# Patient Record
Sex: Male | Born: 1975 | Race: Black or African American | Hispanic: No | Smoking: Never smoker
Health system: Southern US, Community
[De-identification: ages and names within clinical notes are randomized; demographics above are authoritative.]

## PROBLEM LIST (undated history)

## (undated) DIAGNOSIS — G5603 Carpal tunnel syndrome, bilateral upper limbs: Secondary | ICD-10-CM

---

## 2009-01-16 ENCOUNTER — Emergency Department (HOSPITAL_COMMUNITY): Admission: EM | Admit: 2009-01-16 | Discharge: 2009-01-16 | Payer: Self-pay | Admitting: Emergency Medicine

## 2010-05-17 ENCOUNTER — Emergency Department (HOSPITAL_COMMUNITY): Admission: EM | Admit: 2010-05-17 | Discharge: 2010-05-17 | Payer: Self-pay | Admitting: Emergency Medicine

## 2010-11-05 ENCOUNTER — Emergency Department (HOSPITAL_COMMUNITY)
Admission: EM | Admit: 2010-11-05 | Discharge: 2010-11-05 | Payer: Self-pay | Source: Home / Self Care | Admitting: Emergency Medicine

## 2010-11-12 LAB — BASIC METABOLIC PANEL
BUN: 11 mg/dL (ref 6–23)
CO2: 29 mEq/L (ref 19–32)
Calcium: 9.5 mg/dL (ref 8.4–10.5)
Chloride: 106 mEq/L (ref 96–112)
Creatinine, Ser: 0.87 mg/dL (ref 0.4–1.5)
GFR calc Af Amer: >60 mL/min (ref >60)
GFR calc non Af Amer: >60 mL/min (ref >60)
Glucose, Bld: 120 mg/dL — ABNORMAL HIGH (ref 70–99)
Potassium: 4.1 mEq/L (ref 3.5–5.1)
Sodium: 141 mEq/L (ref 135–145)

## 2010-11-12 LAB — DIFFERENTIAL
Basophils Absolute: 0 10*3/uL (ref 0.0–0.1)
Basophils Relative: 0 % (ref 0–1)
Eosinophils Absolute: 0.1 10*3/uL (ref 0.0–0.7)
Eosinophils Relative: 2 % (ref 0–5)
Lymphocytes Relative: 43 % (ref 12–46)
Lymphs Abs: 2.5 10*3/uL (ref 0.7–4.0)
Monocytes Absolute: 0.3 10*3/uL (ref 0.1–1.0)
Monocytes Relative: 6 % (ref 3–12)
Neutro Abs: 2.9 10*3/uL (ref 1.7–7.7)
Neutrophils Relative %: 49 % (ref 43–77)

## 2010-11-12 LAB — CBC
HCT: 41.2 % (ref 39.0–52.0)
Hemoglobin: 14.4 g/dL (ref 13.0–17.0)
MCH: 29.6 pg (ref 26.0–34.0)
MCHC: 35 g/dL (ref 30.0–36.0)
MCV: 84.8 fL (ref 78.0–100.0)
Platelets: 189 10*3/uL (ref 150–400)
RBC: 4.86 MIL/uL (ref 4.22–5.81)
RDW: 12.8 % (ref 11.5–15.5)
WBC: 5.8 10*3/uL (ref 4.0–10.5)

## 2010-12-27 ENCOUNTER — Ambulatory Visit (INDEPENDENT_AMBULATORY_CARE_PROVIDER_SITE_OTHER): Payer: Self-pay | Admitting: Otolaryngology

## 2010-12-27 DIAGNOSIS — D487 Neoplasm of uncertain behavior of other specified sites: Secondary | ICD-10-CM

## 2010-12-27 DIAGNOSIS — R220 Localized swelling, mass and lump, head: Secondary | ICD-10-CM

## 2011-01-11 ENCOUNTER — Encounter (HOSPITAL_COMMUNITY): Payer: Self-pay | Attending: Otolaryngology

## 2011-01-11 ENCOUNTER — Other Ambulatory Visit (INDEPENDENT_AMBULATORY_CARE_PROVIDER_SITE_OTHER): Payer: Self-pay | Admitting: Otolaryngology

## 2011-01-11 DIAGNOSIS — Z01812 Encounter for preprocedural laboratory examination: Secondary | ICD-10-CM | POA: Insufficient documentation

## 2011-01-11 LAB — BASIC METABOLIC PANEL
BUN: 15 mg/dL (ref 6–23)
CO2: 25 mEq/L (ref 19–32)
Calcium: 9.3 mg/dL (ref 8.4–10.5)
Chloride: 108 mEq/L (ref 96–112)
Creatinine, Ser: 0.78 mg/dL (ref 0.4–1.5)
GFR calc Af Amer: >60 mL/min (ref >60)
GFR calc non Af Amer: >60 mL/min (ref >60)
Glucose, Bld: 147 mg/dL — ABNORMAL HIGH (ref 70–99)
Potassium: 4 mEq/L (ref 3.5–5.1)
Sodium: 139 mEq/L (ref 135–145)

## 2011-01-11 LAB — SURGICAL PCR SCREEN
MRSA, PCR: NEGATIVE
Staphylococcus aureus: NEGATIVE

## 2011-01-11 LAB — HEMOGLOBIN AND HEMATOCRIT, BLOOD
HCT: 37.8 % — ABNORMAL LOW (ref 39.0–52.0)
Hemoglobin: 13 g/dL (ref 13.0–17.0)

## 2011-01-12 LAB — CBC
HCT: 48.4 % (ref 39.0–52.0)
Hemoglobin: 16.3 g/dL (ref 13.0–17.0)
MCH: 30.1 pg (ref 26.0–34.0)
MCHC: 33.6 g/dL (ref 30.0–36.0)
MCV: 89.6 fL (ref 78.0–100.0)
Platelets: 278 10*3/uL (ref 150–400)
RBC: 5.41 MIL/uL (ref 4.22–5.81)
RDW: 12.6 % (ref 11.5–15.5)
WBC: 13.5 10*3/uL — ABNORMAL HIGH (ref 4.0–10.5)

## 2011-01-12 LAB — GLUCOSE, CAPILLARY
Glucose-Capillary: 281 mg/dL — ABNORMAL HIGH (ref 70–99)
Glucose-Capillary: 306 mg/dL — ABNORMAL HIGH (ref 70–99)
Glucose-Capillary: 389 mg/dL — ABNORMAL HIGH (ref 70–99)
Glucose-Capillary: 429 mg/dL — ABNORMAL HIGH (ref 70–99)

## 2011-01-12 LAB — URINE MICROSCOPIC-ADD ON

## 2011-01-12 LAB — DIFFERENTIAL
Basophils Absolute: 0 10*3/uL (ref 0.0–0.1)
Basophils Relative: 0 % (ref 0–1)
Eosinophils Absolute: 0.1 10*3/uL (ref 0.0–0.7)
Eosinophils Relative: 1 % (ref 0–5)
Lymphocytes Relative: 29 % (ref 12–46)
Lymphs Abs: 3.9 10*3/uL (ref 0.7–4.0)
Monocytes Absolute: 0.8 10*3/uL (ref 0.1–1.0)
Monocytes Relative: 6 % (ref 3–12)
Neutro Abs: 8.7 10*3/uL — ABNORMAL HIGH (ref 1.7–7.7)
Neutrophils Relative %: 64 % (ref 43–77)

## 2011-01-12 LAB — BASIC METABOLIC PANEL
BUN: 23 mg/dL (ref 6–23)
CO2: 25 mEq/L (ref 19–32)
Calcium: 10.2 mg/dL (ref 8.4–10.5)
Chloride: 99 mEq/L (ref 96–112)
Creatinine, Ser: 1.07 mg/dL (ref 0.4–1.5)
GFR calc Af Amer: >60 mL/min (ref >60)
GFR calc non Af Amer: >60 mL/min (ref >60)
Glucose, Bld: 428 mg/dL — ABNORMAL HIGH (ref 70–99)
Potassium: 4 mEq/L (ref 3.5–5.1)
Sodium: 136 mEq/L (ref 135–145)

## 2011-01-12 LAB — URINALYSIS, ROUTINE W REFLEX MICROSCOPIC
Bilirubin Urine: NEGATIVE
Glucose, UA: >1000 mg/dL — AB
Ketones, ur: 15 mg/dL — AB
Leukocytes, UA: NEGATIVE
Nitrite: NEGATIVE
Protein, ur: 100 mg/dL — AB
Specific Gravity, Urine: >1.03 — ABNORMAL HIGH (ref 1.005–1.030)
Urobilinogen, UA: 0.2 mg/dL (ref 0.0–1.0)
pH: 6 (ref 5.0–8.0)

## 2011-01-17 ENCOUNTER — Other Ambulatory Visit (INDEPENDENT_AMBULATORY_CARE_PROVIDER_SITE_OTHER): Payer: Self-pay | Admitting: Otolaryngology

## 2011-01-17 ENCOUNTER — Ambulatory Visit (HOSPITAL_COMMUNITY)
Admission: RE | Admit: 2011-01-17 | Discharge: 2011-01-17 | Disposition: A | Discharge status: Home / Self Care | Payer: Self-pay | Source: Ambulatory Visit | Attending: Otolaryngology | Admitting: Otolaryngology

## 2011-01-17 DIAGNOSIS — E119 Type 2 diabetes mellitus without complications: Secondary | ICD-10-CM | POA: Insufficient documentation

## 2011-01-17 DIAGNOSIS — R599 Enlarged lymph nodes, unspecified: Secondary | ICD-10-CM

## 2011-01-17 LAB — GLUCOSE, CAPILLARY: Glucose-Capillary: 149 mg/dL — ABNORMAL HIGH (ref 70–99)

## 2011-01-24 ENCOUNTER — Ambulatory Visit (INDEPENDENT_AMBULATORY_CARE_PROVIDER_SITE_OTHER): Payer: Self-pay | Admitting: Otolaryngology

## 2011-02-06 NOTE — Op Note (Signed)
NAME:  Jake Perkins, Jake Perkins                 ACCOUNT NO.:  [id number]  MEDICAL RECORD NO.:  [id number]           PATIENT TYPE:  O  LOCATION:  [facility]                          FACILITY:  [facility]  PHYSICIAN:  Newman Pies, MD            DATE OF BIRTH:  02-02-1976  DATE OF PROCEDURE:  01/17/2011 DATE OF DISCHARGE:                              OPERATIVE REPORT   SURGEON:  Newman Pies, MD  PREOPERATIVE DIAGNOSIS:  Diffuse cervical lymphadenopathy.  A 2-cm enlarged lymph nodes can be easily palpable in the submandibular area.  POSTOPERATIVE DIAGNOSIS:  Diffuse cervical lymphadenopathy.  A 2-cm enlarged lymph nodes can be easily palpable in the submandibular area.  PROCEDURE PERFORMED:  Excisional biopsy of deep submandibular lymph nodes.  ANESTHESIA:  General anesthesia via laryngeal mass.  COMPLICATIONS:  None.  ESTIMATED BLOOD LOSS:  Minimal.  INDICATIONS FOR PROCEDURE:  The patient is a 35 year old male with a 2- month history of bilateral cervical lymphadenopathy.  He recently underwent a CT scan, which showed adenopathy throughout the neck, involving bilateral level I, II and III.  A particularly large 2-cm midline submandibular lymph node is palpable.  In light of the CT and clinical findings, the decision was made for the patient to undergo excision biopsy of one of the enlarged lymph nodes.  The risks, benefits, alternatives, and details of the procedure were discussed with the patient.  Questions were invited and answered.  Informed consent was obtained.  DESCRIPTION:  The patient was taken to the operating room and placed supine on the operating table.  General anesthesia was administered by the anesthesiologist.  The patient was positioned and prepped and draped in the standard fashion for cervical lymph node biopsy.  A 1% lidocaine with 1:100,000 epinephrine was injected at the planned site of incisions.  A 2-cm mobile submandibular lymph node was noted.  It is within the submental  region, slightly to the right side.  A transverse incisions was made overlying the enlarged lymph node.  The incision was carried down past the level of the platysma muscles.  The strap muscles were identified and retracted laterally.  A 2-cm enlarged lymph node was noted.  It was carefully dissected free from the surrounding soft tissue.  Hemostasis was achieved with a combination of vessel ligation and Bovie electrocautery.  The entire lymph node was removed and sent to the Pathology Department for histologic identification.  The specimen was sent as a fresh specimens per lymphoma protocol.  The surgical site was copiously irrigated.  The incision was closed in layers with 4-0 Vicryl and Dermabond.  That concluded the procedure for the patient. The care of the patient was turned over to the anesthesiologist.  The patient was awakened from anesthesia without difficulty.  He was extubated and transferred to the recovery room in good condition.  OPERATIVE FINDINGS:  Enlarged 2-cm submandibular lymph node was noted. It was excised and sent to the Pathology Department as a fresh specimen.  SPECIMEN:  Enlarged submandibular lymph nodes.  FOLLOWUP CARE:  The patient will be discharged home once he is awake and  alert.  He will be placed on Vicodin 1-2 tablet p.o. q.4-6 h. p.r.n. pain, and amoxicillin 500 mg p.o. t.i.d. for 5 days.  He will follow up in my office in approximately 1 week.     Newman Pies, MD     ST/MEDQ  D:  01/17/2011  T:  01/17/2011  Job:  [id number]  cc:   [facility]  Electronically Signed by Newman Pies MD on 02/06/2011 11:32:42 AM

## 2011-07-11 ENCOUNTER — Encounter: Payer: Self-pay | Admitting: Emergency Medicine

## 2011-07-11 ENCOUNTER — Emergency Department (HOSPITAL_COMMUNITY)
Admission: EM | Admit: 2011-07-11 | Discharge: 2011-07-11 | Disposition: A | Discharge status: Home / Self Care | Payer: Self-pay | Attending: Emergency Medicine | Admitting: Emergency Medicine

## 2011-07-11 DIAGNOSIS — E119 Type 2 diabetes mellitus without complications: Secondary | ICD-10-CM | POA: Insufficient documentation

## 2011-07-11 DIAGNOSIS — K1120 Sialoadenitis, unspecified: Secondary | ICD-10-CM | POA: Insufficient documentation

## 2011-07-11 DIAGNOSIS — K1121 Acute sialoadenitis: Secondary | ICD-10-CM

## 2011-07-11 MED ORDER — HYDROCODONE-ACETAMINOPHEN 5-325 MG PO TABS: 1.0000 | ORAL_TABLET | Freq: Four times a day (QID) | ORAL | Status: AC | PRN

## 2011-07-11 MED ORDER — HYDROCODONE-ACETAMINOPHEN 5-325 MG PO TABS
1.0000 | ORAL_TABLET | Freq: Once | ORAL | Status: AC
  Administered 2011-07-11: 1 via ORAL
  Filled 2011-07-11: qty 1

## 2011-07-11 MED ORDER — CEPHALEXIN 500 MG PO CAPS: 500.0000 mg | ORAL_CAPSULE | Freq: Four times a day (QID) | ORAL | Status: AC

## 2011-07-11 MED ORDER — CEPHALEXIN 500 MG PO CAPS
500.0000 mg | ORAL_CAPSULE | Freq: Once | ORAL | Status: AC
  Administered 2011-07-11: 500 mg via ORAL
  Filled 2011-07-11: qty 1

## 2011-07-11 NOTE — ED Provider Notes (Signed)
Medical screening examination/treatment/procedure(s) were performed by non-physician practitioner and as supervising physician I was immediately available for consultation/collaboration.   Cheree Fowles M Ciena Sampley, DO 07/11/11 1930 

## 2011-07-11 NOTE — ED Provider Notes (Signed)
History     CSN: [id number] Arrival date & time: 07/11/2011  2:50 PM   Chief Complaint  Patient presents with  . Neck Pain     (Include location/radiation/quality/duration/timing/severity/associated sxs/prior treatment) HPI Comments: swelling worsens after eating or drinking.  Patient is a 35 y.o. male presenting with neck pain. The history is provided by the patient. No language interpreter was used.  Neck Pain  This is a new problem. The current episode started 2 days ago. The problem occurs constantly. The problem has not changed since onset.The pain is associated with nothing.     Past Medical History  Diagnosis Date  . Diabetes mellitus      History reviewed. No pertinent past surgical history.  History reviewed. No pertinent family history.  History  Substance Use Topics  . Smoking status: Never Smoker   . Smokeless tobacco: Not on file  . Alcohol Use: No      Review of Systems  HENT: Positive for neck pain. Negative for sore throat and dental problem.     Allergies  Review of patient's allergies indicates no known allergies.  Home Medications  No current outpatient prescriptions on file.  Physical Exam    BP 130/79  Pulse 84  Temp(Src) 98.4 F (36.9 C) (Oral)  Resp 17  Ht 5\' 5"  (1.651 m)  Wt 175 lb (79.379 kg)  BMI 29.12 kg/m2  SpO2 100%  Physical Exam  Nursing note and vitals reviewed. Constitutional: He is oriented to person, place, and time. Vital signs are normal. He appears well-developed and well-nourished. No distress.  HENT:  Head: Normocephalic and atraumatic.    Right Ear: External ear normal.  Left Ear: External ear normal.  Nose: Nose normal.  Mouth/Throat: No oropharyngeal exudate.  Eyes: Conjunctivae and EOM are normal. Pupils are equal, round, and reactive to light. Right eye exhibits no discharge. Left eye exhibits no discharge. No scleral icterus.  Neck: Normal range of motion. Neck supple. No JVD present. No tracheal  deviation present. No thyromegaly present.  Cardiovascular: Normal rate, regular rhythm, normal heart sounds, intact distal pulses and normal pulses.  Exam reveals no gallop and no friction rub.   No murmur heard. Pulmonary/Chest: Effort normal and breath sounds normal. No stridor. No respiratory distress. He has no wheezes. He has no rales. He exhibits no tenderness.  Abdominal: Soft. Normal appearance and bowel sounds are normal. He exhibits no distension and no mass. There is no tenderness. There is no rebound and no guarding.  Musculoskeletal: Normal range of motion. He exhibits no edema and no tenderness.  Lymphadenopathy:    He has no cervical adenopathy.  Neurological: He is alert and oriented to person, place, and time. He has normal reflexes. No cranial nerve deficit. Coordination normal. GCS eye subscore is 4. GCS verbal subscore is 5. GCS motor subscore is 6.  Skin: Skin is warm and dry. No rash noted. He is not diaphoretic.  Psychiatric: He has a normal mood and affect. His speech is normal and behavior is normal. Judgment and thought content normal. Cognition and memory are normal.    ED Course  Procedures  Results for orders placed during the hospital encounter of 01/17/11  GLUCOSE, CAPILLARY      Component Value Range   Glucose-Capillary 149 (*) 70 - 99 (mg/dL)   No results found.   No diagnosis found.   MDM        Worthy Rancher, PA 07/11/11 1710

## 2011-07-11 NOTE — ED Notes (Signed)
Pt c/o enlarged gland on his neck. Pt states this happens at times. He states it started swelling today.

## 2011-07-11 NOTE — ED Notes (Signed)
Correction to discharge time: 1748

## 2012-01-16 ENCOUNTER — Emergency Department (HOSPITAL_COMMUNITY)
Admission: EM | Admit: 2012-01-16 | Discharge: 2012-01-16 | Disposition: A | Discharge status: Home / Self Care | Payer: Self-pay | Attending: Emergency Medicine | Admitting: Emergency Medicine

## 2012-01-16 ENCOUNTER — Encounter (HOSPITAL_COMMUNITY): Payer: Self-pay | Admitting: *Deleted

## 2012-01-16 DIAGNOSIS — G5600 Carpal tunnel syndrome, unspecified upper limb: Secondary | ICD-10-CM | POA: Insufficient documentation

## 2012-01-16 DIAGNOSIS — E119 Type 2 diabetes mellitus without complications: Secondary | ICD-10-CM | POA: Insufficient documentation

## 2012-01-16 DIAGNOSIS — R209 Unspecified disturbances of skin sensation: Secondary | ICD-10-CM | POA: Insufficient documentation

## 2012-01-16 LAB — GLUCOSE, CAPILLARY: Glucose-Capillary: 169 mg/dL — ABNORMAL HIGH (ref 70–99)

## 2012-01-16 NOTE — ED Notes (Signed)
Pt c/o pain in his arms and numbness in his hands x 1 week intermittently. States that it hurts worse when he is at work.

## 2012-01-16 NOTE — Discharge Instructions (Signed)
Carpal Tunnel Syndrome The carpal tunnel is a narrow hollow area in the wrist. It is formed by the wrist bones and ligaments. Nerves, blood vessels, and tendons (cord like structures which attach muscle to bone) on the palm side (the side of your hand in the direction your fingers bend) of your hand pass through the carpal tunnel. Repeated wrist motion or certain diseases may cause swelling within the tunnel. (That is why these are called repetitive trauma (damage caused by over use) disorders. It is also a common problem in late pregnancy.) This swelling pinches the main nerve in the wrist (median nerve) and causes the painful condition called carpal tunnel syndrome. A feeling of "pins and needles" may be noticed in the fingers or hand; however, the entire arm may ache from this condition. Carpal tunnel syndrome may clear up by itself. Cortisone injections may help. Sometimes, an operation may be needed to free the pinched nerve. An electromyogram (a type of test) may be needed to confirm this diagnosis (learning what is wrong). This is a test which measures nerve conduction. The nerve conduction is usually slowed in a carpal tunnel syndrome. HOME CARE INSTRUCTIONS   If your caregiver prescribed medication to help reduce swelling, take as directed.   If you were given a splint to keep your wrist from bending, use it as instructed. It is important to wear the splint at night. Use the splint for as long as you have pain or numbness in your hand, arm or wrist. This may take 1 to 2 months.   If you have pain at night, it may help to rub or shake your hand, or elevate your hand above the level of your heart (the center of your chest).   It is important to give your wrist a rest by stopping the activities that are causing the problem. If your symptoms (problems) are work-related, you may need to talk to your employer about changing to a job that does not require using your wrist.   Only take over-the-counter  or prescription medicines for pain, discomfort, or fever as directed by your caregiver.   Following periods of extended use, particularly strenuous use, apply an ice pack wrapped in a towel to the anterior (palm) side of the affected wrist for 20 to 30 minutes. Repeat as needed three to four times per day. This will help reduce the swelling.   Follow all instructions for follow-up with your caregiver. This includes any orthopedic referrals, physical therapy, and rehabilitation. Any delay in obtaining necessary care could result in a delay or failure of your condition to heal.  SEEK IMMEDIATE MEDICAL CARE IF:   You are still having pain and numbness following a week of treatment.   You develop new, unexplained symptoms.   Your current symptoms are getting worse and are not helped or controlled with medications.  MAKE SURE YOU:   Understand these instructions.   Will watch your condition.   Will get help right away if you are not doing well or get worse.  Document Released: 10/11/2000 Document Revised: 10/03/2011 Document Reviewed: 08/30/2011 Exit[employer] Patient Information 2012 [employer]. 

## 2012-01-16 NOTE — ED Provider Notes (Signed)
History     CSN: [id number]  Arrival date & time 01/16/12  1753   First MD Initiated Contact with Patient 01/16/12 1839      Chief Complaint  Patient presents with  . Numbness    (Consider location/radiation/quality/duration/timing/severity/associated sxs/prior treatment) The history is provided by the patient.   Patient here with numbness to both hands x1 week. Symptoms are worse in the evenings after work. 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 Rest makes her symptoms better. Denies any severe headaches, ataxia, or other neurological findings. No medications used her therapy prior to arrival. No prior history of this. Past Medical History  Diagnosis Date  . Diabetes mellitus     History reviewed. No pertinent past surgical history.  History reviewed. No pertinent family history.  History  Substance Use Topics  . Smoking status: Never Smoker   . Smokeless tobacco: Not on file  . Alcohol Use: No      Review of Systems  All other systems reviewed and are negative.    Allergies  Review of patient's allergies indicates no known allergies.  Home Medications   Current Outpatient Rx  Name Route Sig Dispense Refill  . LISINOPRIL 5 MG PO TABS Oral Take 5 mg by mouth daily.    Marland Kitchen METFORMIN HCL ER 500 MG PO TB24 Oral Take 500 mg by mouth daily with breakfast.    . ONE-A-DAY MENS PO TABS Oral Take 1 tablet by mouth daily.      BP 143/79  Pulse 96  Temp(Src) 98.5 F (36.9 C) (Oral)  Resp 16  Ht 5\' 9"  (1.753 m)  Wt 179 lb (81.194 kg)  BMI 26.43 kg/m2  SpO2 98%  Physical Exam  Nursing note and vitals reviewed. Constitutional: He is oriented to person, place, and time. Vital signs are normal. He appears well-developed and well-nourished.  Non-toxic appearance. No distress.  HENT:  Head: Normocephalic and atraumatic.  Eyes: Conjunctivae, EOM and lids are normal. Pupils are equal, round, and reactive to light.  Neck: Normal range of  motion. Neck supple. No tracheal deviation present. No mass present.  Cardiovascular: Normal rate, regular rhythm and normal heart sounds.  Exam reveals no gallop.   No murmur heard. Pulmonary/Chest: Effort normal and breath sounds normal. No stridor. No respiratory distress. He has no decreased breath sounds. He has no wheezes. He has no rhonchi. He has no rales.  Abdominal: Soft. Normal appearance and bowel sounds are normal. He exhibits no distension. There is no tenderness. There is no rebound and no CVA tenderness.  Musculoskeletal: Normal range of motion. He exhibits no edema and no tenderness.       Patient with normal flexion and extension of wrist bilaterally. Radial pulse normal both sides. Neurovascular intact distally in both hands.  Neurological: He is alert and oriented to person, place, and time. He has normal strength. No cranial nerve deficit or sensory deficit. GCS eye subscore is 4. GCS verbal subscore is 5. GCS motor subscore is 6.  Skin: Skin is warm and dry. No abrasion and no rash noted.  Psychiatric: He has a normal mood and affect. His speech is normal and behavior is normal.    ED Course  Procedures (including critical care time)  Labs Reviewed  GLUCOSE, CAPILLARY - Abnormal; Notable for the following:    Glucose-Capillary 169 (*)    All other components within normal limits   No results found.   No diagnosis found.  MDM  Suspect that cause of patient's current symptoms as carpal tunnel syndrome. He'll be placed in Velcro wrist splints and will followup with his Dr. as needed        Toy Baker, MD 01/16/12 [phone]

## 2012-03-30 ENCOUNTER — Emergency Department (HOSPITAL_COMMUNITY): Payer: Self-pay

## 2012-03-30 ENCOUNTER — Emergency Department (HOSPITAL_COMMUNITY)
Admission: EM | Admit: 2012-03-30 | Discharge: 2012-03-30 | Disposition: A | Discharge status: Home / Self Care | Payer: Self-pay | Attending: Emergency Medicine | Admitting: Emergency Medicine

## 2012-03-30 ENCOUNTER — Encounter (HOSPITAL_COMMUNITY): Payer: Self-pay | Admitting: *Deleted

## 2012-03-30 DIAGNOSIS — R059 Cough, unspecified: Secondary | ICD-10-CM | POA: Insufficient documentation

## 2012-03-30 DIAGNOSIS — R05 Cough: Secondary | ICD-10-CM | POA: Insufficient documentation

## 2012-03-30 DIAGNOSIS — M25519 Pain in unspecified shoulder: Secondary | ICD-10-CM | POA: Insufficient documentation

## 2012-03-30 DIAGNOSIS — K5900 Constipation, unspecified: Secondary | ICD-10-CM | POA: Insufficient documentation

## 2012-03-30 DIAGNOSIS — R109 Unspecified abdominal pain: Secondary | ICD-10-CM | POA: Insufficient documentation

## 2012-03-30 DIAGNOSIS — R0789 Other chest pain: Secondary | ICD-10-CM

## 2012-03-30 DIAGNOSIS — E119 Type 2 diabetes mellitus without complications: Secondary | ICD-10-CM | POA: Insufficient documentation

## 2012-03-30 DIAGNOSIS — M949 Disorder of cartilage, unspecified: Secondary | ICD-10-CM | POA: Insufficient documentation

## 2012-03-30 DIAGNOSIS — IMO0001 Reserved for inherently not codable concepts without codable children: Secondary | ICD-10-CM | POA: Insufficient documentation

## 2012-03-30 DIAGNOSIS — M899 Disorder of bone, unspecified: Secondary | ICD-10-CM | POA: Insufficient documentation

## 2012-03-30 LAB — URINALYSIS, ROUTINE W REFLEX MICROSCOPIC
Glucose, UA: 250 mg/dL — AB
Hgb urine dipstick: NEGATIVE
Specific Gravity, Urine: 1.025 (ref 1.005–1.030)
Urobilinogen, UA: 0.2 mg/dL (ref 0.0–1.0)
pH: 6 (ref 5.0–8.0)

## 2012-03-30 LAB — BASIC METABOLIC PANEL
CO2: 25 mEq/L (ref 19–32)
Chloride: 105 mEq/L (ref 96–112)
GFR calc non Af Amer: >90 mL/min (ref >90)
Glucose, Bld: 158 mg/dL — ABNORMAL HIGH (ref 70–99)
Potassium: 3.6 mEq/L (ref 3.5–5.1)
Sodium: 138 mEq/L (ref 135–145)

## 2012-03-30 MED ORDER — POLYETHYLENE GLYCOL 3350 17 GM/SCOOP PO POWD: 17.0000 g | Freq: Every day | ORAL | Status: AC

## 2012-03-30 MED ORDER — NAPROXEN 500 MG PO TABS: 500.0000 mg | ORAL_TABLET | Freq: Two times a day (BID) | ORAL | Status: DC

## 2012-03-30 NOTE — ED Notes (Signed)
Pt has swollen area lower sternal area for mos,  Body aches,  Cough, nausea, no vomiting.  Constipation.

## 2012-03-30 NOTE — Discharge Instructions (Signed)
Arthralgia Arthralgia is joint pain. A joint is a place where two bones meet. Joint pain can happen for many reasons. The joint can be bruised, stiff, infected, or weak from aging. Pain usually goes away after resting and taking medicine for soreness.  HOME CARE  Rest the joint as told by your doctor.   Keep the sore joint raised (elevated) for the first 24 hours.   Put ice on the joint area.   Put ice in a plastic bag.   Place a towel between your skin and the bag.   Leave the ice on for 15 to 20 minutes, 3 to 4 times a day.   Wear your splint, casting, elastic bandage, or sling as told by your doctor.   Only take medicine as told by your doctor. Do not take aspirin.   Use crutches as told by your doctor. Do not put weight on the joint until told to by your doctor.  GET HELP RIGHT AWAY IF:   You have bruising, puffiness (swelling), or more pain.   Your fingers or toes turn blue or start to lose feeling (numb).   Your medicine does not lessen the pain.   Your pain becomes severe.   You have a temperature by mouth above 102 F (38.9 C), not controlled by medicine.   You cannot move or use the joint.  MAKE SURE YOU:   Understand these instructions.   Will watch your condition.   Will get help right away if you are not doing well or get worse.  Document Released: 10/02/2009 Document Revised: 10/03/2011 Document Reviewed: 10/02/2009 Exit[employer] Patient Information 2012 [employer]. 

## 2012-04-01 NOTE — ED Provider Notes (Signed)
History     CSN: [id number]  Arrival date & time 03/30/12  1055   First MD Initiated Contact with Patient 03/30/12 1201      No chief complaint on file.   (Consider location/radiation/quality/duration/timing/severity/associated sxs/prior treatment) HPI Comments: Jake Perkins presents with multiple complaints today,  The first being constipation and intermittent abdominal cramping which has been presents for months,  States his last "real" bowel movement was over 1 week ago.  He denies any nausea,  Vomiting,  Fevers or chills.  He also describes intermittent generalized body aches and non productive cough without chest pain or shortness of breath.  He has noticed pain in his left shoulder which is persistent when he raises his left arm above shoulder level and has noticed a bony prominence at his lateral left clavicle which is tender. 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  Last complaint is for tenderness at his xiphoid process which has been present for months and worse with palpation - he denies any injury to explain this symptom.  The history is provided by the patient.    Past Medical History  Diagnosis Date  . Diabetes mellitus     History reviewed. No pertinent past surgical history.  History reviewed. No pertinent family history.  History  Substance Use Topics  . Smoking status: Never Smoker   . Smokeless tobacco: Not on file  . Alcohol Use: No      Review of Systems  Constitutional: Negative for fever.  HENT: Negative for congestion, sore throat and neck pain.   Eyes: Negative.   Respiratory: Positive for cough. Negative for chest tightness and shortness of breath.   Cardiovascular: Negative for chest pain.  Gastrointestinal: Positive for constipation. Negative for nausea, vomiting, abdominal pain, diarrhea and rectal pain.  Genitourinary: Negative.     Musculoskeletal: Positive for arthralgias. Negative for joint swelling.  Skin: Negative.  Negative for rash and wound.  Neurological: Negative for dizziness, weakness, light-headedness, numbness and headaches.  Hematological: Negative.   Psychiatric/Behavioral: Negative.     Allergies  Review of patient's allergies indicates no known allergies.  Home Medications   Current Outpatient Rx  Name Route Sig Dispense Refill  . LISINOPRIL 5 MG PO TABS Oral Take 5 mg by mouth daily.    Marland Kitchen METFORMIN HCL 500 MG PO TABS Oral Take 500 mg by mouth 2 (two) times daily with a meal.    . ONE-A-DAY MENS PO TABS Oral Take 1 tablet by mouth daily.    Marland Kitchen NAPROXEN 500 MG PO TABS Oral Take 1 tablet (500 mg total) by mouth 2 (two) times daily. 30 tablet 0  . POLYETHYLENE GLYCOL 3350 PO POWD Oral Take 17 g by mouth daily. 527 g 0    BP 140/71  Pulse 98  Temp(Src) 98.5 F (36.9 C) (Oral)  Resp 20  Ht 5\' 5"  (1.651 m)  Wt 180 lb (81.647 kg)  BMI 29.95 kg/m2  SpO2 97%  Physical Exam  Nursing note and vitals reviewed. Constitutional: He appears well-developed and well-nourished.  HENT:  Head: Normocephalic and atraumatic.  Eyes: Conjunctivae are normal.  Neck: Normal range of motion.  Cardiovascular: Normal rate, regular rhythm, normal heart sounds and intact distal pulses.   Pulmonary/Chest: Effort normal and breath sounds normal. No respiratory distress. He has no decreased breath sounds. He has no wheezes. He has no rhonchi. He has no  rales. He exhibits tenderness.         TTP of xiphoid process without enlargement or deformity.  Abdominal: Soft. Bowel sounds are normal. There is no tenderness.  Musculoskeletal: Normal range of motion.       Left shoulder: He exhibits tenderness. He exhibits no deformity.       TTP at [facility] joint with slight bony prominence.  Pt has FROM of left shoulder with no crepitus,  Modest discomfort with flexion and abduction above shoulder height.    Neurological: He is  alert.       Equal grip strength.  Skin: Skin is warm and dry.  Psychiatric: He has a normal mood and affect.    ED Course  Procedures (including critical care time)  Labs Reviewed  BASIC METABOLIC PANEL - Abnormal; Notable for the following:    Glucose, Bld 158 (*)    All other components within normal limits  URINALYSIS, ROUTINE W REFLEX MICROSCOPIC - Abnormal; Notable for the following:    Glucose, UA 250 (*)    All other components within normal limits  LAB REPORT - SCANNED   Dg Shoulder Left  03/30/2012  *RADIOLOGY REPORT*  Clinical Data: Pain without trauma.  LEFT SHOULDER - 2+ VIEW  Comparison: None.  Findings: Negative for fracture, dislocation, or other acute abnormality.  Normal alignment and mineralization. No significant degenerative change.  Regional soft tissues unremarkable.  IMPRESSION:  Negative  Original Report Authenticated By: Osa Craver, M.D.   Dg Abd Acute W/chest  03/30/2012  *RADIOLOGY REPORT*  Clinical Data: Abdominal pain, constipation  ACUTE ABDOMEN SERIES (ABDOMEN 2 VIEW & CHEST 1 VIEW)  Comparison: None.  Findings: Lungs are clear.  Heart size normal. No effusion.  No free air.  Normal bowel gas pattern.  No abnormal abdominal calcifications.  Visualized bones unremarkable.  IMPRESSION:  1.  Normal bowel gas pattern. 2.  No acute cardiopulmonary disease.  Original Report Authenticated By: Osa Craver, M.D.     1. Constipation   2. Xiphoidalgia   3. Shoulder pain       MDM   xrays and labs reviewed.  No findings consistent with emergent condition.  Pt prescribed miralax and naproxen.  Encouraged heat therapy to xiphoid area,  Avoid rubbing area. F/u with his pcp for further concerns ([facility]  The patient appears reasonably screened and/or stabilized for discharge and I doubt any other medical condition or other [facility] requiring further screening, evaluation, or treatment in the ED at this time prior to  discharge.        Burgess Amor, [place] 04/01/12 1244

## 2012-04-03 NOTE — ED Provider Notes (Signed)
Medical screening examination/treatment/procedure(s) were performed by non-physician practitioner and as supervising physician I was immediately available for consultation/collaboration.  Haevyn Ury, MD 04/03/12 0712 

## 2012-09-10 ENCOUNTER — Telehealth (HOSPITAL_COMMUNITY): Payer: Self-pay | Admitting: Dietician

## 2012-09-10 NOTE — Telephone Encounter (Signed)
Pt registered for diabetes class on 09/10/12 at 6:30 PM. Pt was a no-show for class.  

## 2012-10-08 ENCOUNTER — Encounter (HOSPITAL_COMMUNITY): Payer: Self-pay | Admitting: Dietician

## 2012-10-08 NOTE — Progress Notes (Signed)
[facility] Diabetes Class Completion  Date:October 08, 2012  Time: 6:30 PM  Pt attended [facility]'s Diabetes Group Education Class on October 08, 2012.   Patient was educated on the following topics: survival skills (signs and symptoms of hyperglycemia and hypoglycemia, treatment for hypoglycemia, ideal levels for fasting and postprandial blood sugars, goal Hgb A1c level, foot care basics), recommendations for physical activity, carbohydrate metabolism in relation to diabetes, and meal planning (sources of carbohydrate, carbohydrate counting, meal planning strategies, food label reading, and portion control).   Melody Haver, RD, LDN

## 2012-12-23 ENCOUNTER — Emergency Department (HOSPITAL_COMMUNITY): Payer: Self-pay

## 2012-12-23 ENCOUNTER — Encounter (HOSPITAL_COMMUNITY): Payer: Self-pay

## 2012-12-23 ENCOUNTER — Emergency Department (HOSPITAL_COMMUNITY)
Admission: EM | Admit: 2012-12-23 | Discharge: 2012-12-23 | Disposition: A | Discharge status: Home / Self Care | Payer: Self-pay | Attending: Emergency Medicine | Admitting: Emergency Medicine

## 2012-12-23 DIAGNOSIS — R197 Diarrhea, unspecified: Secondary | ICD-10-CM | POA: Insufficient documentation

## 2012-12-23 DIAGNOSIS — E119 Type 2 diabetes mellitus without complications: Secondary | ICD-10-CM | POA: Insufficient documentation

## 2012-12-23 DIAGNOSIS — R1032 Left lower quadrant pain: Secondary | ICD-10-CM | POA: Insufficient documentation

## 2012-12-23 DIAGNOSIS — K5900 Constipation, unspecified: Secondary | ICD-10-CM | POA: Insufficient documentation

## 2012-12-23 DIAGNOSIS — Z79899 Other long term (current) drug therapy: Secondary | ICD-10-CM | POA: Insufficient documentation

## 2012-12-23 DIAGNOSIS — R109 Unspecified abdominal pain: Secondary | ICD-10-CM

## 2012-12-23 LAB — URINALYSIS, ROUTINE W REFLEX MICROSCOPIC
Glucose, UA: NEGATIVE mg/dL
Leukocytes, UA: NEGATIVE
Protein, ur: NEGATIVE mg/dL
Specific Gravity, Urine: 1.02 (ref 1.005–1.030)

## 2012-12-23 MED ORDER — POLYETHYLENE GLYCOL 3350 17 G PO PACK: 17.0000 g | PACK | Freq: Every day | ORAL | Status: DC

## 2012-12-23 MED ORDER — DOCUSATE SODIUM 100 MG PO CAPS: 100.0000 mg | ORAL_CAPSULE | Freq: Two times a day (BID) | ORAL | Status: DC

## 2012-12-23 NOTE — ED Provider Notes (Signed)
    Medical screening examination/treatment/procedure(s) were conducted as a shared visit with non-physician practitioner(s) and myself.  I personally evaluated the patient during the encounter  Please see my separate respective documentation pertaining to this patient encounter    Vida Roller, MD 12/23/12 [phone]

## 2012-12-23 NOTE — ED Notes (Signed)
Pt reports has had pain in his right side off and on for the past few months.  Reports abd pain started last night after eating.  Reports diarrhea. No n/v.

## 2012-12-23 NOTE — ED Provider Notes (Signed)
History     CSN: [id number]  Arrival date & time 12/23/12  1150   First MD Initiated Contact with Patient 12/23/12 1233      Chief Complaint  Patient presents with  . Abdominal Pain    (Consider location/radiation/quality/duration/timing/severity/associated sxs/prior treatment) Patient is a 37 y.o. male presenting with abdominal pain. The history is provided by the patient.  Abdominal Pain Pain location:  LLQ Pain radiates to:  Does not radiate Pain severity:  Mild Onset quality:  Sudden Duration:  3 hours Timing:  Constant Progression:  Improving Chronicity:  New Context: suspicious food intake (ate black beans last night which often cause diarrhea)   Context: not sick contacts   Relieved by:  Nothing Worsened by:  Nothing tried Ineffective treatments:  None tried Associated symptoms: constipation and diarrhea   Associated symptoms: no fever and no vomiting   Diarrhea:    Quality:  Watery   Severity:  Mild   Duration:  3 hours   Timing:  Constant   Progression:  Improving The diarrhea happened this morning while Mr. Kolbeck was at work. He said there was only a small amount. It was nonbloody. Prior to this episode he has not been able to have a bowel movement for 4-7 days. He does not remember his last BM. He occasionally uses laxatives and takes fiber which has been helpful in the past.   Mr. Atkerson also complains of a pain in his side. This pain alternates between throbbing and sharp. It radiates to his back. The pain alternates between his right and left side. When the pain comes it only lasts for a few seconds. He has been seen by his primary care provider for this issue, but not given a diagnosis. The pain is not currently present.   He states he is 80% compliant with his home medication.   Past Medical History  Diagnosis Date  . Diabetes mellitus     History reviewed. No pertinent past surgical history.  No family history on file.  History  Substance Use Topics   . Smoking status: Never Smoker   . Smokeless tobacco: Not on file  . Alcohol Use: No      Review of Systems  Constitutional: Negative for fever.  Gastrointestinal: Positive for abdominal pain, diarrhea and constipation. Negative for vomiting.  All other systems reviewed and are negative.    Allergies  Review of patient's allergies indicates no known allergies.  Home Medications   Current Outpatient Rx  Name  Route  Sig  Dispense  Refill  . glipiZIDE (GLUCOTROL) 10 MG tablet   Oral   Take 10 mg by mouth daily. Patient has just been prescribed medication and he has not picked up from pharmacy         . lisinopril (PRINIVIL,ZESTRIL) 5 MG tablet   Oral   Take 5 mg by mouth at bedtime.          . metFORMIN (GLUCOPHAGE) 500 MG tablet   Oral   Take 500 mg by mouth 2 (two) times daily with a meal.         . omega-3 acid ethyl esters (LOVAZA) 1 G capsule   Oral   Take 1 g by mouth 3 (three) times daily.           BP 140/78  Pulse 89  Temp(Src) 98.1 F (36.7 C) (Oral)  Resp 20  Ht 5\' 6"  (1.676 m)  Wt 175 lb (79.379 kg)  BMI 28.26 kg/m2  SpO2  99%  Physical Exam  Constitutional: Vital signs are normal. He appears well-developed and well-nourished. He is cooperative.  Non-toxic appearance. He does not have a sickly appearance. He does not appear ill. No distress.  HENT:  Head: Normocephalic and atraumatic.  Right Ear: External ear normal.  Left Ear: External ear normal.  Nose: Nose normal.  Mouth/Throat: Oropharynx is clear and moist and mucous membranes are normal.  Eyes: Conjunctivae, EOM and lids are normal.  Neck: Trachea normal, normal range of motion and phonation normal.  Cardiovascular: Normal rate, regular rhythm, S1 normal, S2 normal and normal heart sounds.  Exam reveals no gallop and no friction rub.   No murmur heard. Pulmonary/Chest: Effort normal and breath sounds normal. No respiratory distress. He has no wheezes. He has no rales.  Abdominal:  Soft. Bowel sounds are normal. He exhibits no distension. There is tenderness in the left lower quadrant. There is no rebound.  Musculoskeletal: Normal range of motion.  No tenderness or reproducible symptoms over left or right flank where patient states pain comes and goes - no swelling or erythema   Neurological: He is alert.  Skin: Skin is warm and dry.  Psychiatric: He has a normal mood and affect. His speech is normal and behavior is normal.    ED Course  Procedures (including critical care time)  Labs Reviewed  URINALYSIS, ROUTINE W REFLEX MICROSCOPIC   Results for orders placed during the hospital encounter of 12/23/12  URINALYSIS, ROUTINE W REFLEX MICROSCOPIC      Result Value Range   Color, Urine YELLOW  YELLOW   APPearance CLEAR  CLEAR   Specific Gravity, Urine 1.020  1.005 - 1.030   pH 6.5  5.0 - 8.0   Glucose, UA NEGATIVE  NEGATIVE mg/dL   Hgb urine dipstick NEGATIVE  NEGATIVE   Bilirubin Urine NEGATIVE  NEGATIVE   Ketones, ur NEGATIVE  NEGATIVE mg/dL   Protein, ur NEGATIVE  NEGATIVE mg/dL   Urobilinogen, UA 0.2  0.0 - 1.0 mg/dL   Nitrite NEGATIVE  NEGATIVE   Leukocytes, UA NEGATIVE  NEGATIVE   Dg Abd 1 View  12/23/2012  *RADIOLOGY REPORT*  Clinical Data: Abdominal pain.  ABDOMEN - 1 VIEW  Comparison: 03/30/2012  Findings: There is a nonobstructive bowel gas pattern.  No supine evidence of free air.  No organomegaly or suspicious calcification.  No acute bony abnormality.  IMPRESSION: No acute findings.   Original Report Authenticated By: Charlett Nose, M.D.      1. Constipation   2. Abdominal pain       MDM  KUB showed no acute findings. History consistent with constipation. No concern for obstruction or perforation. Given laxative and stool softener. Follow up with PCP for maintenence issues. Patient voices understanding and agrees with the plan. UA to rule out UTI following dark concentrated looking urine - WNL.        Mora Bellman, PA-C 12/23/12  1451

## 2012-12-23 NOTE — ED Provider Notes (Signed)
37 year old male with no significant medical history presents with a complaint of intermittent lower abdominal pain. He doesn't feel like he can pinpoint exactly where the pain is and he is not currently having the pain. He does admit to having only one bowel movement in the last week which was 40 60, he often goes long periods of time without a bowel movement. He denies nausea vomiting fevers chills dysuria or diarrhea but he does admit to having urinary frequency over the last couple of days.  On exam the patient is a soft nontender abdomen with no masses, no peritoneal signs, no guarding. He does not appear to be tender on my exam. His heart and lungs are clear.  KUB has been performed showing signs of stool but no overt constipation with fecal impaction. The patient appears well, he has dark-colored urine, urinalysis is pending but the patient appears stable and will likely be discharged.  Medical screening examination/treatment/procedure(s) were conducted as a shared visit with non-physician practitioner(s) and myself.  I personally evaluated the patient during the encounter    Vida Roller, MD 12/23/12 [phone]

## 2013-03-29 ENCOUNTER — Encounter (HOSPITAL_COMMUNITY): Payer: Self-pay

## 2013-03-29 ENCOUNTER — Emergency Department (HOSPITAL_COMMUNITY): Payer: Self-pay

## 2013-03-29 ENCOUNTER — Emergency Department (HOSPITAL_COMMUNITY)
Admission: EM | Admit: 2013-03-29 | Discharge: 2013-03-29 | Disposition: A | Discharge status: Home / Self Care | Payer: Self-pay | Attending: Emergency Medicine | Admitting: Emergency Medicine

## 2013-03-29 DIAGNOSIS — R739 Hyperglycemia, unspecified: Secondary | ICD-10-CM

## 2013-03-29 DIAGNOSIS — E1169 Type 2 diabetes mellitus with other specified complication: Secondary | ICD-10-CM | POA: Insufficient documentation

## 2013-03-29 DIAGNOSIS — Z79899 Other long term (current) drug therapy: Secondary | ICD-10-CM | POA: Insufficient documentation

## 2013-03-29 DIAGNOSIS — R42 Dizziness and giddiness: Secondary | ICD-10-CM | POA: Insufficient documentation

## 2013-03-29 DIAGNOSIS — R51 Headache: Secondary | ICD-10-CM | POA: Insufficient documentation

## 2013-03-29 LAB — POCT I-STAT, CHEM 8
BUN: 11 mg/dL (ref 6–23)
Hemoglobin: 13.3 g/dL (ref 13.0–17.0)
Sodium: 137 mEq/L (ref 135–145)
TCO2: 25 mmol/L (ref 0–100)

## 2013-03-29 LAB — POCT I-STAT TROPONIN I: Troponin i, poc: 0 ng/mL (ref 0.00–0.08)

## 2013-03-29 NOTE — ED Notes (Signed)
Troponin resulted 0.25. Test repeated and EDP aware

## 2013-03-29 NOTE — ED Provider Notes (Signed)
History     This chart was scribed for Jake Quarry, MD, MD by Smitty Pluck, ED Scribe. The patient was seen in room APA19/APA19 and the patient's care was started at 7:49 AM.   CSN: [id number]  Arrival date & time 03/29/13  0703      Chief Complaint  Patient presents with  . Dizziness     The history is provided by the patient and medical records. No language interpreter was used.   HPI Comments: Jake Perkins is a 37 y.o. male with hx of DM (diagnosed 3 years ago) who presents to the Emergency Department complaining of intermittent, moderate light-headedness onset 2 weeks ago. Pt reports that he feels pressure in his head. He denies any alleviating factors and aggravating factors. He states that he has had a HA after onset after light-headedness but denies current HA. Pt denies chest pain, visual disturbance, head injury, fever, changes in weight, changes in medication, chills, nausea, vomiting, diarrhea, weakness, cough, SOB and any other pain. He takes lisinopril, glipizide and metformin. He denies drinking alcohol and smoking cigarettes.   Pt goes to [facility] for medical treatment.   Past Medical History  Diagnosis Date  . Diabetes mellitus     History reviewed. No pertinent past surgical history.  No family history on file.  History  Substance Use Topics  . Smoking status: Never Smoker   . Smokeless tobacco: Not on file  . Alcohol Use: No      Review of Systems  Constitutional: Negative for fever and chills.  Eyes: Negative for visual disturbance.  Neurological: Positive for light-headedness. Negative for weakness.  All other systems reviewed and are negative.    Allergies  Review of patient's allergies indicates no known allergies.  Home Medications   Current Outpatient Rx  Name  Route  Sig  Dispense  Refill  . fish oil-omega-3 fatty acids 1000 MG capsule   Oral   Take 2 g by mouth daily.         Marland Kitchen glipiZIDE (GLUCOTROL) 10 MG tablet   Oral   Take 10 mg by mouth daily. Patient has just been prescribed medication and he has not picked up from pharmacy         . lisinopril (PRINIVIL,ZESTRIL) 5 MG tablet   Oral   Take 5 mg by mouth at bedtime.          . metFORMIN (GLUCOPHAGE) 500 MG tablet   Oral   Take 500 mg by mouth 2 (two) times daily with a meal.         . docusate sodium (COLACE) 100 MG capsule   Oral   Take 1 capsule (100 mg total) by mouth every 12 (twelve) hours.   30 capsule   0   . omega-3 acid ethyl esters (LOVAZA) 1 G capsule   Oral   Take 1 g by mouth 3 (three) times daily.         . polyethylene glycol (MIRALAX / GLYCOLAX) packet   Oral   Take 17 g by mouth daily.   14 each   0     BP 123/80  Pulse 72  Temp(Src) 97.9 F (36.6 C) (Oral)  Resp 18  Ht 5\' 6"  (1.676 m)  Wt 179 lb (81.194 kg)  BMI 28.91 kg/m2  SpO2 96%  Physical Exam  Nursing note and vitals reviewed. Constitutional: He is oriented to person, place, and time. He appears well-developed and well-nourished. No distress.  HENT:  Head: Normocephalic and atraumatic.  Right Ear: External ear normal.  Left Ear: External ear normal.  Nose: Nose normal.  Mouth/Throat: Oropharynx is clear and moist.  Eyes: Conjunctivae and EOM are normal. Pupils are equal, round, and reactive to light.  Neck: Normal range of motion. Neck supple. No tracheal deviation present.  Cardiovascular: Normal rate, regular rhythm, normal heart sounds and intact distal pulses.  Exam reveals no gallop and no friction rub.   No murmur heard. Pulmonary/Chest: Effort normal and breath sounds normal. No respiratory distress. He has no wheezes. He has no rales. He exhibits no tenderness.  Abdominal: Soft. Bowel sounds are normal. He exhibits no distension and no mass. There is no tenderness. There is no rebound and no guarding.  Musculoskeletal: Normal range of motion. He exhibits no edema and no tenderness.  Neurological: He is alert and oriented to  person, place, and time. He has normal reflexes. No cranial nerve deficit. He exhibits normal muscle tone. Coordination normal.  Skin: Skin is warm and dry.  Psychiatric: He has a normal mood and affect. His behavior is normal. Judgment and thought content normal.    ED Course  Procedures (including critical care time) DIAGNOSTIC STUDIES: Oxygen Saturation is 96% on room air, adequate by my interpretation.    COORDINATION OF CARE: 7:54 AM Discussed ED treatment with pt and pt agrees.   Results for orders placed during the hospital encounter of 03/29/13  POCT I-STAT, CHEM 8      Result Value Range   Sodium 137  135 - 145 mEq/L   Potassium 3.9  3.5 - 5.1 mEq/L   Chloride 104  96 - 112 mEq/L   BUN 11  6 - 23 mg/dL   Creatinine, Ser 4.09  0.50 - 1.35 mg/dL   Glucose, Bld 811 (*) 70 - 99 mg/dL   Calcium, Ion 9.14  7.82 - 1.23 mmol/L   TCO2 25  0 - 100 mmol/L   Hemoglobin 13.3  13.0 - 17.0 g/dL   HCT 95.6  21.3 - 08.6 %  POCT I-STAT TROPONIN I      Result Value Range   Troponin i, poc 0.00  0.00 - 0.08 ng/mL   Comment 3           POCT I-STAT TROPONIN I      Result Value Range   Troponin i, poc 0.25 (*) 0.00 - 0.08 ng/mL   Comment MD NOTIFIED, REPEAT TEST     Comment 3            Dg Chest 2 View   Dg Chest 2 View  03/29/2013   *RADIOLOGY REPORT*  Clinical Data:  Dizziness and lightheadedness.  CHEST - 2 VIEW  Comparison: Frontal chest film from an abdominal series dated 03/30/2012.  Findings: The heart size and mediastinal contours are within normal limits.  Both lungs are clear.  The visualized skeletal structures are unremarkable.  IMPRESSION: No active disease.   Original Report Authenticated By: Irish Lack, M.D.     No diagnosis found.   Date: 03/29/2013  Rate: 55  Rhythm: normal sinus rhythm  QRS Axis: normal  Intervals: normal  ST/T Wave abnormalities: normal  Conduction Disutrbances: none  Narrative Interpretation: unremarkable   Results for orders placed  during the hospital encounter of 03/29/13  POCT I-STAT, CHEM 8      Result Value Range   Sodium 137  135 - 145 mEq/L   Potassium 3.9  3.5 - 5.1 mEq/L   Chloride  104  96 - 112 mEq/L   BUN 11  6 - 23 mg/dL   Creatinine, Ser 9.81  0.50 - 1.35 mg/dL   Glucose, Bld 191 (*) 70 - 99 mg/dL   Calcium, Ion 4.78  2.95 - 1.23 mmol/L   TCO2 25  0 - 100 mmol/L   Hemoglobin 13.3  13.0 - 17.0 g/dL   HCT 62.1  30.8 - 65.7 %  POCT I-STAT TROPONIN I      Result Value Range   Troponin i, poc 0.00  0.00 - 0.08 ng/mL   Comment 3           POCT I-STAT TROPONIN I      Result Value Range   Troponin i, poc 0.25 (*) 0.00 - 0.08 ng/mL   Comment MD NOTIFIED, REPEAT TEST     Comment 3              Filed Vitals:   03/29/13 0726  BP: 123/80  Pulse: 72  Temp:   Resp: 18    MDM   Patient presents with several week history of lighheadedness, no symptoms by history of vertigo, no focal neuro deficits.  Patient with normal exam, ekg, and labs except hyperglycemia.  Patient advised to follow with his pmd for tighter glucose control which may contribute to his symptoms.      I personally performed the services described in this documentation, which was scribed in my presence. The recorded information has been reviewed and considered.    Jake Quarry, MD 03/29/13 [phone]

## 2013-03-29 NOTE — ED Notes (Signed)
Repeat trop 0.00. EDP aware

## 2013-03-29 NOTE — ED Notes (Signed)
Pt states he has had dizziness for weeks. States there is nothing that makes it worse or better. Denies other symptoms

## 2013-03-29 NOTE — ED Notes (Signed)
Pt states for 2 weeks he has been feeling lightheaded and dizzy at times,, occasional chest tightness

## 2013-11-06 ENCOUNTER — Emergency Department (HOSPITAL_COMMUNITY): Payer: Self-pay

## 2013-11-06 ENCOUNTER — Emergency Department (HOSPITAL_COMMUNITY)
Admission: EM | Admit: 2013-11-06 | Discharge: 2013-11-06 | Disposition: A | Discharge status: Home / Self Care | Payer: Self-pay | Attending: Emergency Medicine | Admitting: Emergency Medicine

## 2013-11-06 ENCOUNTER — Encounter (HOSPITAL_COMMUNITY): Payer: Self-pay | Admitting: Emergency Medicine

## 2013-11-06 DIAGNOSIS — E119 Type 2 diabetes mellitus without complications: Secondary | ICD-10-CM | POA: Insufficient documentation

## 2013-11-06 DIAGNOSIS — K5900 Constipation, unspecified: Secondary | ICD-10-CM | POA: Insufficient documentation

## 2013-11-06 DIAGNOSIS — Z79899 Other long term (current) drug therapy: Secondary | ICD-10-CM | POA: Insufficient documentation

## 2013-11-06 NOTE — Discharge Instructions (Signed)
Constipation, Adult Constipation is when a person:  Poops (bowel movement) less than 3 times a week.  Has a hard time pooping.  Has poop that is dry, hard, or bigger than normal. HOME CARE   Eat more fiber, such as fruits, vegetables, whole grains like brown rice, and beans.  Eat less fatty foods and sugar. This includes [place] fries, hamburgers, cookies, candy, and soda.  If you are not getting enough fiber from food, take products with added fiber in them (supplements).  Drink enough fluid to keep your pee (urine) clear or pale yellow.  Go to the restroom when you feel like you need to poop. Do not hold it.  Only take medicine as told by your doctor. Do not take medicines that help you poop (laxatives) without talking to your doctor first.  Exercise on a regular basis, or as told by your doctor. GET HELP RIGHT AWAY IF:   You have bright red blood in your poop (stool).  Your constipation lasts more than 4 days or gets worse.  You have belly (abdomen) or butt (rectal) pain.  You have thin poop (as thin as a pencil).  You lose weight, and it cannot be explained. MAKE SURE YOU:   Understand these instructions.  Will watch your condition.  Will get help right away if you are not doing well or get worse. Document Released: 04/01/2008 Document Revised: 01/06/2012 Document Reviewed: 07/26/2013 [facility] Patient Information 2014 [place], [place].   Increase fluids, fruits, fibers. Recommend magnesium citrate and fleets enema. Return to the emergency department if not better in 2-3 days.

## 2013-11-06 NOTE — ED Provider Notes (Signed)
CSN: [id number]     Arrival date & time 11/06/13  1335 History  This chart was scribed for Nat Christen, MD by Jenne Campus, ED Scribe. This patient was seen in room APA03/APA03 and the patient's care was started at 2:19 PM.   Chief Complaint  Patient presents with  . Constipation  . Abdominal Pain    The history is provided by the patient. No language interpreter was used.    HPI Comments: Jake Perkins is a 38 y.o. male who presents to the Emergency Department complaining of constipation for the past 7 days with associated lower abdominal pain that radiates into his back and "up my spine". He states that this has been having this problem since he has gotten older. He reports that he has tried enemas, castro oil, ex lax, magnesium citrate and stool softeners with no real improvement. He states that he will get a small amount of "discolored liquid" but denies any stool.  He has been seen in the ED before the same and waited until "nothing worked" to be seen during this episode. He denies any other complaints currently.   Past Medical History  Diagnosis Date  . Diabetes mellitus    History reviewed. No pertinent past surgical history. History reviewed. No pertinent family history. History  Substance Use Topics  . Smoking status: Never Smoker   . Smokeless tobacco: Never Used  . Alcohol Use: No    Review of Systems  A complete 10 system review of systems was obtained and all systems are negative except as noted in the HPI and PMH.   Allergies  Review of patient's allergies indicates no known allergies.  Home Medications   Current Outpatient Rx  Name  Route  Sig  Dispense  Refill  . Aspirin-Salicylamide-Caffeine (BC HEADACHE) 325-95-16 MG TABS   Oral   Take 1 packet by mouth daily as needed (for pain).         . fish oil-omega-3 fatty acids 1000 MG capsule   Oral   Take 1-3 g by mouth daily.          Marland Kitchen glipiZIDE (GLUCOTROL) 10 MG tablet   Oral   Take 10 mg by mouth  daily.          Marland Kitchen lisinopril (PRINIVIL,ZESTRIL) 5 MG tablet   Oral   Take 5 mg by mouth at bedtime.          . metFORMIN (GLUCOPHAGE) 500 MG tablet   Oral   Take 500 mg by mouth 2 (two) times daily with a meal.          Triage Vitals: BP 129/79  Pulse 96  Temp(Src) 98.3 F (36.8 C) (Oral)  Resp 18  Ht 5\' 6"  (1.676 m)  Wt 180 lb (81.647 kg)  BMI 29.07 kg/m2  SpO2 99%  Physical Exam  Nursing note and vitals reviewed. Constitutional: He is oriented to person, place, and time. He appears well-developed and well-nourished.  HENT:  Head: Normocephalic and atraumatic.  Eyes: Conjunctivae and EOM are normal. Pupils are equal, round, and reactive to light.  Neck: Normal range of motion. Neck supple.  Cardiovascular: Normal rate, regular rhythm and normal heart sounds.   Pulmonary/Chest: Effort normal and breath sounds normal.  Abdominal: Soft. Bowel sounds are normal. There is no tenderness. There is no rebound and no guarding.  Musculoskeletal: Normal range of motion.  Neurological: He is alert and oriented to person, place, and time.  Skin: Skin is warm and dry.  Psychiatric: He has a normal mood and affect. His behavior is normal.    ED Course  Procedures (including critical care time)  DIAGNOSTIC STUDIES: Oxygen Saturation is 99% on RA, normal by my interpretation.    COORDINATION OF CARE:  2:22 PM-Discussed treatment plan which includes an abdominal x-ray followed by a rectal exam with pt at bedside and pt agreed to plan.   Labs Review Labs Reviewed - No data to display Imaging Review Dg Abd Acute W/chest  11/06/2013   CLINICAL DATA:  Low back pain  EXAM: ACUTE ABDOMEN SERIES (ABDOMEN 2 VIEW & CHEST 1 VIEW)  COMPARISON:  Chest radiographs dated 03/29/2013  FINDINGS: Lungs are clear. No pleural effusion or pneumothorax.  Nonobstructive bowel gas pattern.  No evidence of free air under the diaphragm on the upright view.  Visualized osseous structures are within  normal limits.  IMPRESSION: No evidence of acute cardiopulmonary disease.  No evidence of small bowel obstruction or free air.   Electronically Signed   By: Julian Hy M.D.   On: 11/06/2013 14:58    EKG Interpretation   None       MDM   1. Constipation    No acute abdomen.  Rectal exam reveals no masses.   Recommended fleets enema and magnesium citrate. Patient will return in 2-3 days if not better.  I personally performed the services described in this documentation, which was scribed in my presence. The recorded information has been reviewed and is accurate.    Nat Christen, MD 11/06/13 [phone]

## 2013-11-06 NOTE — ED Notes (Signed)
Pt complaining of constipation x 7 days. Has had recurring symptoms 1-2 years. No relief with stool softener and enemas. States "It's like I'm blocked up"

## 2013-11-06 NOTE — ED Notes (Signed)
Pt c/o generalized abdominal pain as well as lower back pain and constipation x7 days. Pt states "this happens all the time and I don't know why". Pt has tried multiple otc products but denies relief. Pt states his last regular BM was 2 weeks ago.

## 2013-11-08 LAB — OCCULT BLOOD, POC DEVICE: FECAL OCCULT BLD: NEGATIVE

## 2014-06-14 IMAGING — CR DG CHEST 2V
2 series · 2 of 2 positions shown · non-contrast
Comparison: Frontal chest film from an abdominal series dated
03/30/2012.

CLINICAL DATA: Dizziness and lightheadedness.

CHEST - 2 VIEW

[view not recorded (1 of 2)]
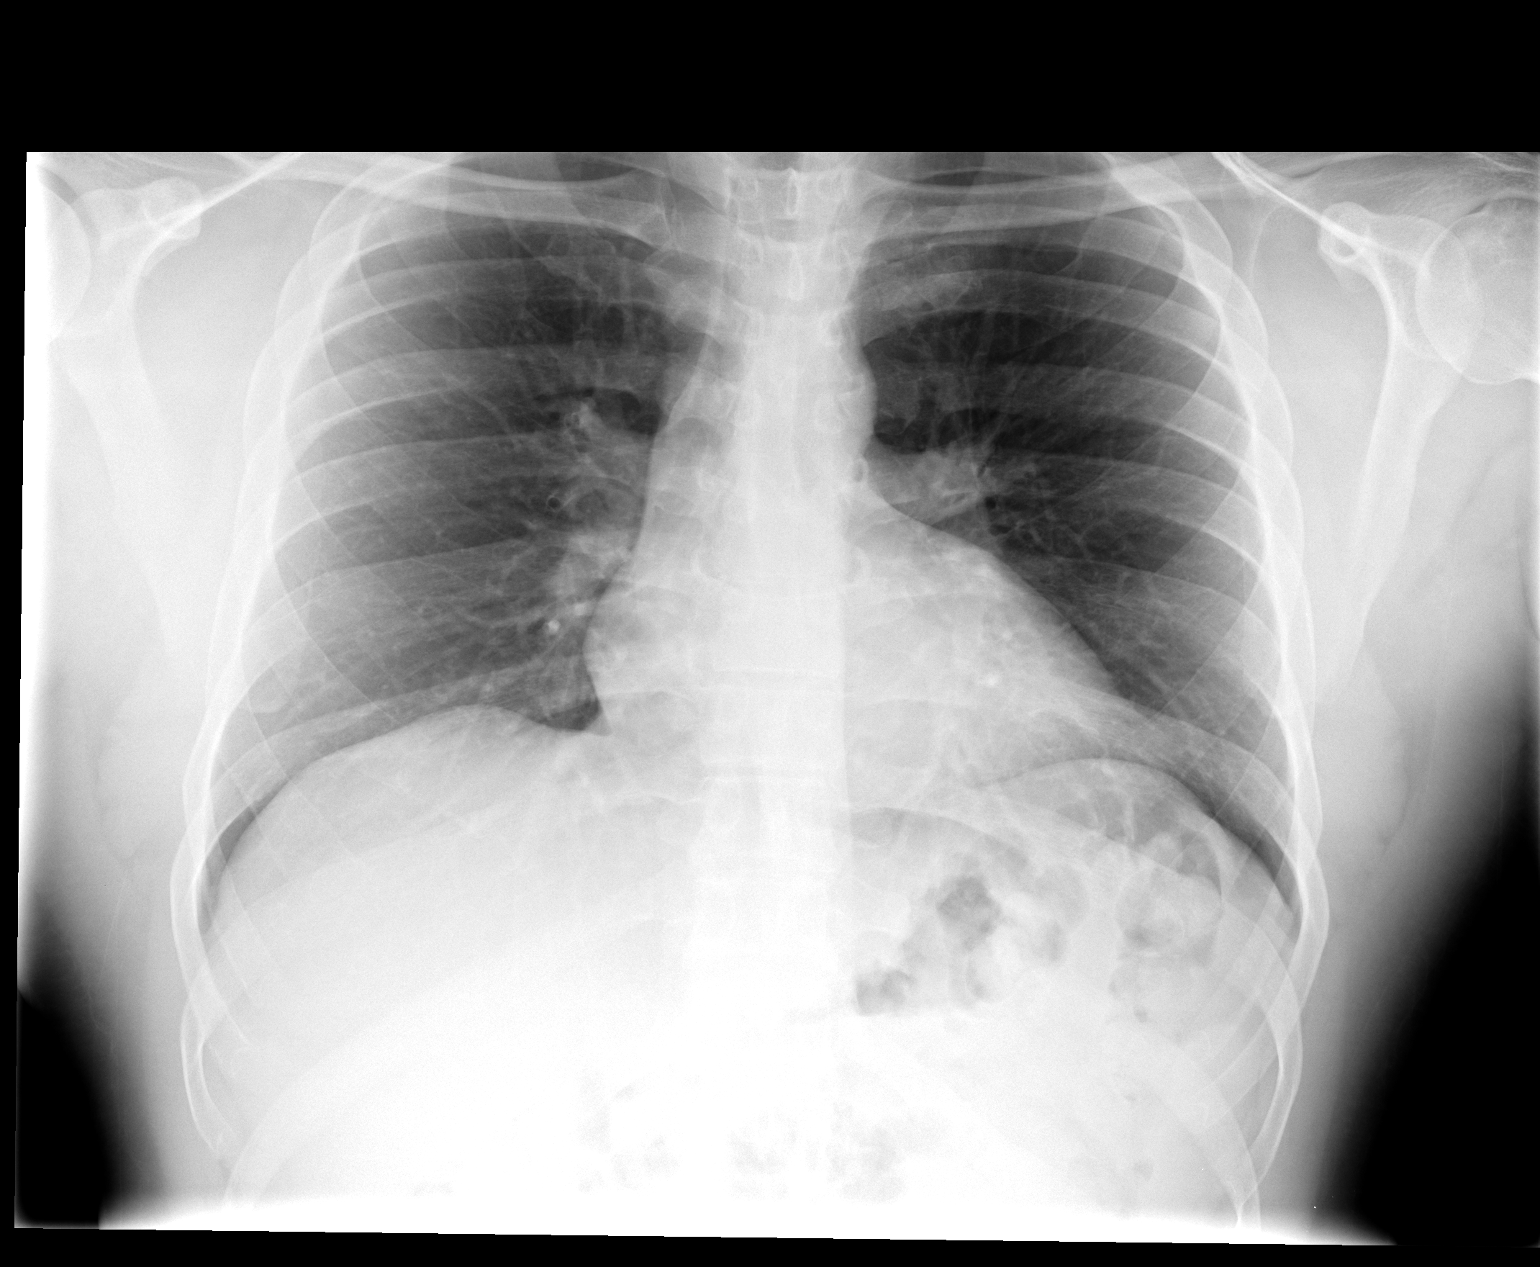

[view not recorded (2 of 2)]
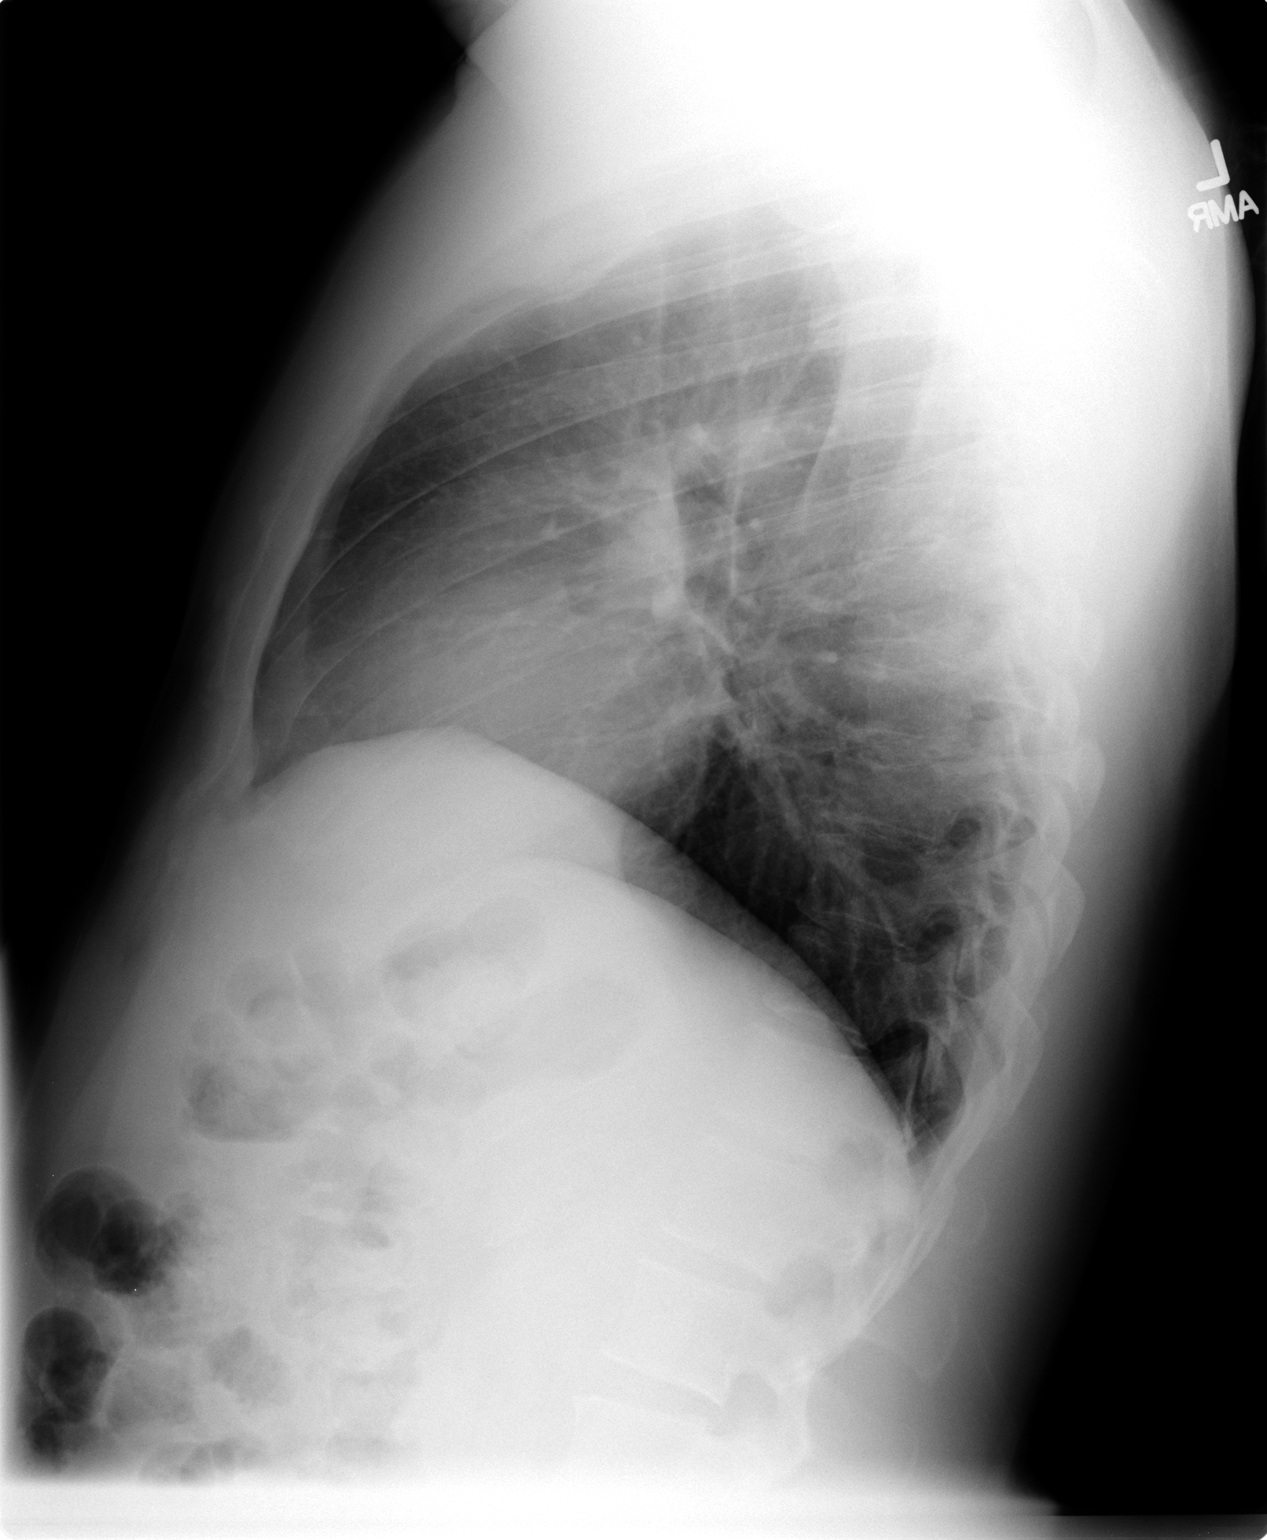

[2 of 2 positions shown; findings below may reference images not displayed]

FINDINGS: The heart size and mediastinal contours are within normal
limits.  Both lungs are clear.  The visualized skeletal structures
are unremarkable.
IMPRESSION: No active disease.

## 2015-08-16 ENCOUNTER — Encounter (HOSPITAL_COMMUNITY): Payer: Self-pay | Admitting: *Deleted

## 2015-08-16 ENCOUNTER — Emergency Department (HOSPITAL_COMMUNITY)
Admission: EM | Admit: 2015-08-16 | Discharge: 2015-08-16 | Disposition: A | Discharge status: Home / Self Care | Payer: Managed Care, Other (non HMO) | Attending: Emergency Medicine | Admitting: Emergency Medicine

## 2015-08-16 DIAGNOSIS — Z79899 Other long term (current) drug therapy: Secondary | ICD-10-CM | POA: Insufficient documentation

## 2015-08-16 DIAGNOSIS — L509 Urticaria, unspecified: Secondary | ICD-10-CM | POA: Insufficient documentation

## 2015-08-16 DIAGNOSIS — L508 Other urticaria: Secondary | ICD-10-CM

## 2015-08-16 DIAGNOSIS — Z7984 Long term (current) use of oral hypoglycemic drugs: Secondary | ICD-10-CM | POA: Insufficient documentation

## 2015-08-16 DIAGNOSIS — E119 Type 2 diabetes mellitus without complications: Secondary | ICD-10-CM | POA: Diagnosis not present

## 2015-08-16 DIAGNOSIS — R21 Rash and other nonspecific skin eruption: Secondary | ICD-10-CM | POA: Diagnosis present

## 2015-08-16 LAB — CBG MONITORING, ED: Glucose-Capillary: 161 mg/dL — ABNORMAL HIGH (ref 65–99)

## 2015-08-16 MED ORDER — SODIUM CHLORIDE 0.9 % IV SOLN
INTRAVENOUS | Status: DC
  Administered 2015-08-16: 05:00:00 via INTRAVENOUS

## 2015-08-16 MED ORDER — PREDNISONE 20 MG PO TABS: ORAL_TABLET | ORAL | Status: DC

## 2015-08-16 MED ORDER — FAMOTIDINE IN NACL 20-0.9 MG/50ML-% IV SOLN
20.0000 mg | Freq: Once | INTRAVENOUS | Status: AC
  Administered 2015-08-16: 20 mg via INTRAVENOUS
  Filled 2015-08-16: qty 50

## 2015-08-16 MED ORDER — CETIRIZINE HCL 10 MG PO CAPS: 10.0000 mg | ORAL_CAPSULE | Freq: Every day | ORAL | Status: DC | PRN

## 2015-08-16 MED ORDER — FAMOTIDINE 20 MG PO TABS: 20.0000 mg | ORAL_TABLET | Freq: Two times a day (BID) | ORAL | Status: DC

## 2015-08-16 MED ORDER — DIPHENHYDRAMINE HCL 50 MG/ML IJ SOLN
50.0000 mg | Freq: Once | INTRAMUSCULAR | Status: AC
  Administered 2015-08-16: 50 mg via INTRAVENOUS
  Filled 2015-08-16: qty 1

## 2015-08-16 MED ORDER — METHYLPREDNISOLONE SODIUM SUCC 125 MG IJ SOLR
125.0000 mg | Freq: Once | INTRAMUSCULAR | Status: AC
  Administered 2015-08-16: 125 mg via INTRAVENOUS
  Filled 2015-08-16: qty 2

## 2015-08-16 NOTE — ED Notes (Addendum)
Per Dr. Tomi Bamberger, pt can stay and sleep until he wakes up and feels ok to drive home due to IV Benadryl he received earlier.

## 2015-08-16 NOTE — ED Notes (Signed)
Pt states he took some Jentadueto last week. Last does was Sat. Started having hives on Monday. No other new meds, foods, clothing or detergents.

## 2015-08-16 NOTE — ED Notes (Signed)
Pt states he took 1 benadryl around 0230.

## 2015-08-16 NOTE — ED Provider Notes (Signed)
CSN: [id number]     Arrival date & time 08/16/15  0320 History   First MD Initiated Contact with Patient 08/16/15 0410     Chief Complaint  Patient presents with  . Rash     (Consider location/radiation/quality/duration/timing/severity/associated sxs/prior Treatment) HPI patient reports he has diabetes. When he had his last check with his doctor he wanted him to try a new diabetic medication, Jentadueto, however patient states his diabetes had been doing well and he did not start it although he was given samples for it. He states he was getting ready to travel to [place] and he ran out of his Anastasio Auerbach, so he took the samples he had with him. He states he took the samples on October 13, 14th, 15th, and possibly the 16th. On the evening of the 17th he started breaking out on his face with a rash that was itching. He has subsequently had welts on his arms and upper chest his upper back and has swelling and redness of his palms with itchiness. He denies any difficulty swallowing or breathing. 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 He states he took one Benadryl at 10:30 PM without relief. He denies any other new contacts other than the new medication. He states they normally travel to [place] twice a year and without having rash.  PCP Dr Hampton Abbot in [place], [place]  Past Medical History  Diagnosis Date  . Diabetes mellitus    History reviewed. No pertinent past surgical history. No family history on file. Social History  Substance Use Topics  . Smoking status: Never Smoker   . Smokeless tobacco: Never Used  . Alcohol Use: No  employed  Review of Systems  All other systems reviewed and are negative.     Allergies  Jentadueto  Home Medications   Prior to Admission medications   Medication Sig Start Date End Date Taking? Authorizing Provider  Aspirin-Salicylamide-Caffeine (BC HEADACHE) 325-95-16 MG TABS Take 1 packet by mouth  daily as needed (for pain).   Yes Historical Provider, MD  canagliflozin (INVOKANA) 100 MG TABS tablet Take 100 mg by mouth daily.   Yes Historical Provider, MD  fish oil-omega-3 fatty acids 1000 MG capsule Take 1-3 g by mouth daily.    Yes Historical Provider, MD  Cetirizine HCl (ZYRTEC ALLERGY) 10 MG CAPS Take 1 capsule (10 mg total) by mouth daily as needed (itching or rash). 08/16/15   Rolland Porter, MD  famotidine (PEPCID) 20 MG tablet Take 1 tablet (20 mg total) by mouth 2 (two) times daily. 08/16/15   Rolland Porter, MD  glipiZIDE (GLUCOTROL) 10 MG tablet Take 10 mg by mouth daily.     Historical Provider, MD  lisinopril (PRINIVIL,ZESTRIL) 5 MG tablet Take 5 mg by mouth at bedtime.     Historical Provider, MD  metFORMIN (GLUCOPHAGE) 500 MG tablet Take 500 mg by mouth 2 (two) times daily with a meal.    Historical Provider, MD  predniSONE (DELTASONE) 20 MG tablet Take 2 po QD x 4d then 1 po QD x 4d 08/16/15   Rolland Porter, MD   BP 122/87 mmHg  Pulse 82  Temp(Src) 98 F (36.7 C) (Oral)  Resp 18  Ht 5\' 6"  (1.676 m)  Wt 170 lb (77.111 kg)  BMI 27.45 kg/m2  SpO2 100%  Vital signs normal   Physical Exam  Constitutional: He is oriented to person, place, and time. He appears well-developed and well-nourished.  Non-toxic appearance. He does not appear ill. No distress.  HENT:  Head: Normocephalic and atraumatic.  Right Ear: External ear normal.  Left Ear: External ear normal.  Nose: Nose normal. No mucosal edema or rhinorrhea.  Mouth/Throat: Oropharynx is clear and moist and mucous membranes are normal. No dental abscesses or uvula swelling.  Eyes: Conjunctivae and EOM are normal. Pupils are equal, round, and reactive to light.  Neck: Normal range of motion and full passive range of motion without pain. Neck supple.  Cardiovascular: Normal rate, regular rhythm and normal heart sounds.  Exam reveals no gallop and no friction rub.   No murmur heard. Pulmonary/Chest: Effort normal and breath sounds  normal. No respiratory distress. He has no wheezes. He has no rhonchi. He has no rales. He exhibits no tenderness and no crepitus.  Abdominal: Soft. Normal appearance and bowel sounds are normal. He exhibits no distension. There is no tenderness. There is no rebound and no guarding.  Musculoskeletal: Normal range of motion. He exhibits no edema or tenderness.  Moves all extremities well.   Neurological: He is alert and oriented to person, place, and time. He has normal strength. No cranial nerve deficit.  Skin: Skin is warm, dry and intact. Rash noted. No erythema. No pallor.  Patient is noted to have some mild diffuse swelling on the palms of his hands with area of redness consistent with angioedema/hives. He also is noted to have discrete hives on his arms, upper chest, upper back.  Psychiatric: He has a normal mood and affect. His speech is normal and behavior is normal. His mood appears not anxious.  Nursing note and vitals reviewed.   ED Course  Procedures (including critical care time)  Medications  0.9 %  sodium chloride infusion ( Intravenous New Bag/Given 08/16/15 0437)  diphenhydrAMINE (BENADRYL) injection 50 mg (50 mg Intravenous Given 08/16/15 0437)  methylPREDNISolone sodium succinate (SOLU-MEDROL) 125 mg/2 mL injection 125 mg (125 mg Intravenous Given 08/16/15 0437)  famotidine (PEPCID) IVPB 20 mg premix (0 mg Intravenous Stopped 08/16/15 0507)   06:00 recheck patient sleeping, easily awakened. pt states his itching is gone. His palms are no longer swollen or itching.  Pt drove himself to the ED, will have ready for discharge. We discussed staying cool. We also discussed the steroids could make his blood sugar get high and he should really watch what he eats while on the prednisone. We discussed worrisome symptoms to return to the ED such as swelling in the throat, tongue, or lips or any difficulty breathing. He was advised to avoid the Jentadueto in the future   Labs  Review Labs Reviewed  CBG MONITORING, ED - Abnormal; Notable for the following:    Glucose-Capillary 161 (*)    All other components within normal limits   Laboratory interpretation all normal except hyperglycemia mild   Imaging Review No results found. I have personally reviewed and evaluated these images and lab results as part of my medical decision-making.   EKG Interpretation None      MDM  patient presents with urticarial lesions on his trunk and extremities and on the palms of his hands after taking a new diabetic medication Jentadueto. His symptoms improved with IV Solu-Medrol, Benadryl, and Pepcid. He was discharged home on similar medication and advised to avoid this medication in the future.    Final diagnoses:  Urticaria, acute   New Prescriptions   CETIRIZINE HCL (ZYRTEC ALLERGY) 10 MG CAPS    Take 1 capsule (10 mg total)  by mouth daily as needed (itching or rash).   FAMOTIDINE (PEPCID) 20 MG TABLET    Take 1 tablet (20 mg total) by mouth 2 (two) times daily.   PREDNISONE (DELTASONE) 20 MG TABLET    Take 2 po QD x 4d then 1 po QD x 4d    Plan discharge  Rolland Porter, MD, Barbette Or, MD 08/16/15 [phone]

## 2015-08-16 NOTE — Discharge Instructions (Signed)
Stay cool, heat will make the rash and itching worse. Take the medications as prescribed. You can also take benadryl 25-50 mg every 6 hrs for itching not controlled by the zyrtec (zyrtec is less sedating than the benadryl and will not make you as sleepy as the benadryl will). The very careful with your diabetes diet because the prednisone can make your blood sugar get high. Recheck if you're still having the rash after a week, he gets swelling in your throat, tongue, or lips, or you have difficulty breathing. Left your doctor know about the reaction after taking the new medication. You should avoid it in the future.   Hives Hives are itchy, red, swollen areas of the skin. They can vary in size and location on your body. Hives can come and go for hours or several days (acute hives) or for several weeks (chronic hives). Hives do not spread from person to person (noncontagious). They may get worse with scratching, exercise, and emotional stress. CAUSES   Allergic reaction to food, additives, or drugs.  Infections, including the common cold.  Illness, such as vasculitis, lupus, or thyroid disease.  Exposure to sunlight, heat, or cold.  Exercise.  Stress.  Contact with chemicals. SYMPTOMS   Red or white swollen patches on the skin. The patches may change size, shape, and location quickly and repeatedly.  Itching.  Swelling of the hands, feet, and face. This may occur if hives develop deeper in the skin. DIAGNOSIS  Your caregiver can usually tell what is wrong by performing a physical exam. Skin or blood tests may also be done to determine the cause of your hives. In some cases, the cause cannot be determined. TREATMENT  Mild cases usually get better with medicines such as antihistamines. Severe cases may require an emergency epinephrine injection. If the cause of your hives is known, treatment includes avoiding that trigger.  HOME CARE INSTRUCTIONS   Avoid causes that trigger your  hives.  Take antihistamines as directed by your caregiver to reduce the severity of your hives. Non-sedating or low-sedating antihistamines are usually recommended. Do not drive while taking an antihistamine.  Take any other medicines prescribed for itching as directed by your caregiver.  Wear loose-fitting clothing.  Keep all follow-up appointments as directed by your caregiver. SEEK MEDICAL CARE IF:   You have persistent or severe itching that is not relieved with medicine.  You have painful or swollen joints. SEEK IMMEDIATE MEDICAL CARE IF:   You have a fever.  Your tongue or lips are swollen.  You have trouble breathing or swallowing.  You feel tightness in the throat or chest.  You have abdominal pain. These problems may be the first sign of a life-threatening allergic reaction. Call your local emergency services (911 in [place] MAKE SURE YOU:   Understand these instructions.  Will watch your condition.  Will get help right away if you are not doing well or get worse.   This information is not intended to replace advice given to you by your health care provider. Make sure you discuss any questions you have with your health care provider.   Document Released: 10/14/2005 Document Revised: 10/19/2013 Document Reviewed: 01/07/2012 Elsevier Interactive Patient Education [employer].

## 2018-07-09 DIAGNOSIS — E119 Type 2 diabetes mellitus without complications: Secondary | ICD-10-CM | POA: Insufficient documentation

## 2019-03-15 ENCOUNTER — Other Ambulatory Visit: Payer: Self-pay

## 2019-03-15 ENCOUNTER — Encounter (HOSPITAL_COMMUNITY): Payer: Self-pay | Admitting: Emergency Medicine

## 2019-03-15 DIAGNOSIS — Z7982 Long term (current) use of aspirin: Secondary | ICD-10-CM | POA: Diagnosis not present

## 2019-03-15 DIAGNOSIS — Z79899 Other long term (current) drug therapy: Secondary | ICD-10-CM | POA: Insufficient documentation

## 2019-03-15 DIAGNOSIS — E119 Type 2 diabetes mellitus without complications: Secondary | ICD-10-CM | POA: Diagnosis not present

## 2019-03-15 DIAGNOSIS — Y9289 Other specified places as the place of occurrence of the external cause: Secondary | ICD-10-CM | POA: Insufficient documentation

## 2019-03-15 DIAGNOSIS — Y9389 Activity, other specified: Secondary | ICD-10-CM | POA: Diagnosis not present

## 2019-03-15 DIAGNOSIS — S62640A Nondisplaced fracture of proximal phalanx of right index finger, initial encounter for closed fracture: Secondary | ICD-10-CM | POA: Diagnosis not present

## 2019-03-15 DIAGNOSIS — Y998 Other external cause status: Secondary | ICD-10-CM | POA: Insufficient documentation

## 2019-03-15 DIAGNOSIS — W230XXA Caught, crushed, jammed, or pinched between moving objects, initial encounter: Secondary | ICD-10-CM | POA: Diagnosis not present

## 2019-03-15 DIAGNOSIS — S6991XA Unspecified injury of right wrist, hand and finger(s), initial encounter: Secondary | ICD-10-CM | POA: Diagnosis present

## 2019-03-15 NOTE — ED Triage Notes (Signed)
Pt c/o right hand pain after slamming it in door tonight.

## 2019-03-16 ENCOUNTER — Emergency Department (HOSPITAL_COMMUNITY): Payer: Managed Care, Other (non HMO)

## 2019-03-16 ENCOUNTER — Emergency Department (HOSPITAL_COMMUNITY)
Admission: EM | Admit: 2019-03-16 | Discharge: 2019-03-16 | Disposition: A | Discharge status: Home / Self Care | Payer: Managed Care, Other (non HMO) | Attending: Emergency Medicine | Admitting: Emergency Medicine

## 2019-03-16 DIAGNOSIS — S62640A Nondisplaced fracture of proximal phalanx of right index finger, initial encounter for closed fracture: Secondary | ICD-10-CM

## 2019-03-16 MED ORDER — HYDROCODONE-ACETAMINOPHEN 5-325 MG PO TABS: 1.0000 | ORAL_TABLET | Freq: Four times a day (QID) | ORAL | 0 refills | Status: DC | PRN

## 2019-03-16 MED ORDER — HYDROCODONE-ACETAMINOPHEN 5-325 MG PO TABS
1.0000 | ORAL_TABLET | Freq: Once | ORAL | Status: AC
  Administered 2019-03-16: 02:00:00 1 via ORAL
  Filled 2019-03-16: qty 1

## 2019-03-16 NOTE — ED Provider Notes (Signed)
[facility] EMERGENCY DEPARTMENT Provider Note   CSN: [id number] Arrival date & time: 03/15/19  2200    History   Chief Complaint Chief Complaint  Patient presents with  . Hand Pain    HPI [name] is a 43 y.o. male.     The history is provided by the patient.  Hand Pain  This is a new problem. The current episode started 6 to 12 hours ago. The problem occurs constantly. The problem has been gradually worsening. The symptoms are aggravated by bending. The symptoms are relieved by rest.   Patient reports he slammed his right hand in a car door earlier.  No other injuries reported Past Medical History:  Diagnosis Date  . Diabetes mellitus     There are no active problems to display for this patient.   History reviewed. No pertinent surgical history.      Home Medications    Prior to Admission medications   Medication Sig Start Date End Date Taking? Authorizing Provider  Aspirin-Salicylamide-Caffeine (BC HEADACHE) 325-95-16 MG TABS Take 1 packet by mouth daily as needed (for pain).    [provider]  canagliflozin (INVOKANA) 100 MG TABS tablet Take 100 mg by mouth daily.    [provider]  Cetirizine HCl (ZYRTEC ALLERGY) 10 MG CAPS Take 1 capsule (10 mg total) by mouth daily as needed (itching or rash). 08/16/15   Rolland Porter, MD  famotidine (PEPCID) 20 MG tablet Take 1 tablet (20 mg total) by mouth 2 (two) times daily. 08/16/15   Rolland Porter, MD  fish oil-omega-3 fatty acids 1000 MG capsule Take 1-3 g by mouth daily.     [provider]  glipiZIDE (GLUCOTROL) 10 MG tablet Take 10 mg by mouth daily.     [provider]  HYDROcodone-acetaminophen (NORCO/VICODIN) 5-325 MG tablet Take 1 tablet by mouth every 6 (six) hours as needed for severe pain. 03/16/19   Ripley Fraise, MD  lisinopril (PRINIVIL,ZESTRIL) 5 MG tablet Take 5 mg by mouth at bedtime.     [provider]  metFORMIN (GLUCOPHAGE) 500 MG tablet Take 500 mg by  mouth 2 (two) times daily with a meal.    [provider]  predniSONE (DELTASONE) 20 MG tablet Take 2 po QD x 4d then 1 po QD x 4d 08/16/15   Rolland Porter, MD    Family History History reviewed. No pertinent family history.  Social History Social History   Tobacco Use  . Smoking status: Never Smoker  . Smokeless tobacco: Never Used  Substance Use Topics  . Alcohol use: No  . Drug use: No     Allergies   Jentadueto [linagliptin-metformin hcl er]   Review of Systems Review of Systems  Constitutional: Negative for fever.  Musculoskeletal: Positive for arthralgias.     Physical Exam Updated Vital Signs BP (!) 149/111 (BP Location: Left Arm)   Pulse 95   Temp 98.4 F (36.9 C) (Oral)   Resp 17   Ht 1.676 m (5\' 6" )   Wt 72.6 kg   SpO2 98%   BMI 25.82 kg/m   Physical Exam  CONSTITUTIONAL: Well developed/well nourished HEAD: Normocephalic/atraumatic EYES: EOMI NECK: supple no meningeal signs CV: S1/S2 noted, no murmurs/rubs/gallops noted LUNGS: Lungs are clear to auscultation bilaterally, no apparent distress ABDOMEN: soft NEURO: Pt is awake/alert/appropriate, moves all extremitiesx4.  No facial droop.   EXTREMITIES: pulses normal/equal, full ROM Tenderness to palpation of right thumb, tenderness to palpation/mild swelling noted palmar aspect of right  hand below index finger.  No laceration.  No deformity No snuffbox tenderness, full range of motion right wrist SKIN: warm, color normal PSYCH: no abnormalities of mood noted, alert and oriented to situation  ED Treatments / Results  Labs (all labs ordered are listed, but only abnormal results are displayed) Labs Reviewed - No data to display  EKG None  Radiology Dg Hand Complete Right  Result Date: 03/16/2019 CLINICAL DATA:  43 year old male status post blunt trauma, hand slammed in car door. First and 2nd digit pain radiating to the arm. EXAM: RIGHT HAND - COMPLETE 3+ VIEW COMPARISON:  None.  FINDINGS: Bone mineralization is within normal limits. Intact distal radius and ulna. Normal carpal bone alignment and joint spaces. Metacarpals appear intact. There is a chronic appearing ossific fragment at the ulnar base of the 2nd proximal phalanx. The 2nd MCP joint space there is preserved. No superimposed acute fracture identified. No discrete soft tissue injury. IMPRESSION: No acute fracture or dislocation identified in the right hand. Small chronic appearing fragment at the base of the 2nd proximal phalanx. Electronically Signed   By: Genevie Ann M.D.   On: 03/16/2019 00:55    Procedures Procedures  SPLINT APPLICATION Date/Time: 2:33 AM Authorized by: Sharyon Cable Consent: Verbal consent obtained. Risks and benefits: risks, benefits and alternatives were discussed Consent given by: patient Splint applied by: nurse Location details: right hand Splint type: volar/thumb spica Supplies used: ortho glass Post-procedure: The splinted body part was neurovascularly unchanged following the procedure. Patient tolerance: Patient tolerated the procedure well with no immediate complications.     Medications Ordered in ED Medications  HYDROcodone-acetaminophen (NORCO/VICODIN) 5-325 MG per tablet 1 tablet (1 tablet Oral Given 03/16/19 0151)     Initial Impression / Assessment and Plan / ED Course  I have reviewed the triage vital signs and the nursing notes.  Pertinent  imaging results that were available during my care of the patient were reviewed by me and considered in my medical decision making (see chart for details).        Although  radiology read reveals this is a chronic injury, patient has acute pain in the area where the fracture is located.  Will place in splint and referred to orthopedics  Final Clinical Impressions(s) / ED Diagnoses   Final diagnoses:  Closed nondisplaced fracture of proximal phalanx of right index finger, initial encounter    ED Discharge Orders          Ordered    HYDROcodone-acetaminophen (NORCO/VICODIN) 5-325 MG tablet  Every 6 hours PRN     03/16/19 0203           Ripley Fraise, MD 03/16/19 0222

## 2019-03-23 ENCOUNTER — Ambulatory Visit (INDEPENDENT_AMBULATORY_CARE_PROVIDER_SITE_OTHER): Payer: Managed Care, Other (non HMO) | Admitting: Orthopaedic Surgery

## 2019-03-23 ENCOUNTER — Other Ambulatory Visit: Payer: Self-pay

## 2019-03-23 ENCOUNTER — Encounter: Payer: Self-pay | Admitting: Orthopaedic Surgery

## 2019-03-23 VITALS — BP 128/91 | HR 82 | Temp 98.1°F | Ht 66.0 in | Wt 172.0 lb

## 2019-03-23 DIAGNOSIS — M79644 Pain in right finger(s): Secondary | ICD-10-CM

## 2019-03-23 NOTE — Progress Notes (Signed)
Subjective:    Patient ID: Jake Perkins, male    DOB: [birth year], 43 y.o.   MRN: [id number]  HPI He caught his right hand in a door last Monday, May 18th.  He had pain of the base of the index finger.  He went to the ER.  X-rays were done and showed: IMPRESSION: No acute fracture or dislocation identified in the right hand. Small chronic appearing fragment at the base of the 2nd proximal phalanx.  He has had lessening of the pain but has some pain still and has developed a very small cyst at the area of the base of the proximal phalanx of the index finger ulnar side.     Review of Systems  Constitutional: Positive for activity change.  Musculoskeletal: Positive for arthralgias and joint swelling.  All other systems reviewed and are negative.  For Review of Systems, all other systems reviewed and are negative.  The following is a summary of the past history medically, past history surgically, known current medicines, social history and family history.  This information is gathered electronically by the computer from prior information and documentation.  I review this each visit and have found including this information at this point in the chart is beneficial and informative.   Past Medical History:  Diagnosis Date  . Diabetes mellitus     History reviewed. No pertinent surgical history.  Current Outpatient Medications on File Prior to Visit  Medication Sig Dispense Refill  . Aspirin-Salicylamide-Caffeine (BC HEADACHE) 325-95-16 MG TABS Take 1 packet by mouth daily as needed (for pain).    . canagliflozin (INVOKANA) 100 MG TABS tablet Take 100 mg by mouth daily.    . Cetirizine HCl (ZYRTEC ALLERGY) 10 MG CAPS Take 1 capsule (10 mg total) by mouth daily as needed (itching or rash). 14 capsule 0  . famotidine (PEPCID) 20 MG tablet Take 1 tablet (20 mg total) by mouth 2 (two) times daily. 20 tablet 0  . fish oil-omega-3 fatty acids 1000 MG capsule Take 1-3 g by mouth daily.     Marland Kitchen  glipiZIDE (GLUCOTROL) 10 MG tablet Take 10 mg by mouth daily.     Marland Kitchen HYDROcodone-acetaminophen (NORCO/VICODIN) 5-325 MG tablet Take 1 tablet by mouth every 6 (six) hours as needed for severe pain. 10 tablet 0  . lisinopril (PRINIVIL,ZESTRIL) 5 MG tablet Take 5 mg by mouth at bedtime.     . metFORMIN (GLUCOPHAGE) 1000 MG tablet Take 1,000 mg by mouth 2 (two) times daily.    . predniSONE (DELTASONE) 20 MG tablet Take 2 po QD x 4d then 1 po QD x 4d 12 tablet 0   No current facility-administered medications on file prior to visit.     Social History   Socioeconomic History  . Marital status: Legally Separated    Spouse name: Not on file  . Number of children: Not on file  . Years of education: Not on file  . Highest education level: Not on file  Occupational History  . Not on file  Social Needs  . Financial resource strain: Not on file  . Food insecurity:    Worry: Not on file    Inability: Not on file  . Transportation needs:    Medical: Not on file    Non-medical: Not on file  Tobacco Use  . Smoking status: Never Smoker  . Smokeless tobacco: Never Used  Substance and Sexual Activity  . Alcohol use: No  . Drug use: No  . Sexual  activity: Yes  Lifestyle  . Physical activity:    Days per week: Not on file    Minutes per session: Not on file  . Stress: Not on file  Relationships  . Social connections:    Talks on phone: Not on file    Gets together: Not on file    Attends religious service: Not on file    Active member of club or organization: Not on file    Attends meetings of clubs or organizations: Not on file    Relationship status: Not on file  . Intimate partner violence:    Fear of current or ex partner: Not on file    Emotionally abused: Not on file    Physically abused: Not on file    Forced sexual activity: Not on file  Other Topics Concern  . Not on file  Social History Narrative  . Not on file    Family History  Problem Relation Age of Onset  .  Diabetes Mother   . Diabetes Father     BP (!) 128/91   Pulse 82   Temp 98.1 F (36.7 C)   Ht 5\' 6"  (1.676 m)   Wt 172 lb (78 kg)   BMI 27.76 kg/m   Body mass index is 27.76 kg/m.     Objective:   Physical Exam Vitals signs reviewed.  Constitutional:      Appearance: He is well-developed.  HENT:     Head: Normocephalic and atraumatic.  Eyes:     Conjunctiva/sclera: Conjunctivae normal.     Pupils: Pupils are equal, round, and reactive to light.  Neck:     Musculoskeletal: Normal range of motion and neck supple.  Cardiovascular:     Rate and Rhythm: Normal rate and regular rhythm.  Pulmonary:     Effort: Pulmonary effort is normal.  Abdominal:     Palpations: Abdomen is soft.  Musculoskeletal:       Hands:  Skin:    Jake: Skin is warm and dry.  Neurological:     Mental Status: He is alert and oriented to person, place, and time.     Cranial Nerves: No cranial nerve deficit.     Motor: No abnormal muscle tone.     Coordination: Coordination normal.     Deep Tendon Reflexes: Reflexes are normal and symmetric. Reflexes normal.  Psychiatric:        Behavior: Behavior normal.        Thought Content: Thought content normal.        Judgment: Judgment normal.     I have reviewed the ER notes and x-rays and x-ray report of 03-16-2019.      Assessment & Plan:   Encounter Diagnosis  Name Primary?  . Pain of finger of right hand Yes   I have advised him that he had an old fracture with new injury of the area and no new fracture.  I have shown him the x-rays.  I have recommended Aspercreme to the area.  I will see him in two weeks.  If better, call and cancel.  Call if any problem.  Precautions discussed.   Electronically Signed Sanjuana Kava, MD 5/26/20208:35 AM

## 2019-03-31 ENCOUNTER — Telehealth: Payer: Self-pay | Admitting: Orthopaedic Surgery

## 2019-03-31 NOTE — Telephone Encounter (Signed)
Patient called to ask if he may have his diagnosis over the phone. I discussed process of releasing through a signed request form. He will call when he can come in to sign. (we will do internally since it is one visit only)

## 2019-04-06 ENCOUNTER — Ambulatory Visit: Payer: Managed Care, Other (non HMO) | Admitting: Orthopaedic Surgery

## 2019-04-06 ENCOUNTER — Encounter: Payer: Self-pay | Admitting: Orthopaedic Surgery

## 2019-04-27 ENCOUNTER — Ambulatory Visit (INDEPENDENT_AMBULATORY_CARE_PROVIDER_SITE_OTHER): Payer: Managed Care, Other (non HMO) | Admitting: Orthopaedic Surgery

## 2019-04-27 ENCOUNTER — Encounter: Payer: Self-pay | Admitting: Orthopaedic Surgery

## 2019-04-27 ENCOUNTER — Other Ambulatory Visit: Payer: Self-pay

## 2019-04-27 VITALS — BP 159/101 | HR 96 | Temp 99.1°F | Ht 66.0 in | Wt 172.0 lb

## 2019-04-27 DIAGNOSIS — M79644 Pain in right finger(s): Secondary | ICD-10-CM | POA: Diagnosis not present

## 2019-04-27 NOTE — Progress Notes (Signed)
Patient Jake Perkins, male DOB:Aug 11, 1976, 43 y.o. [id number]  Chief Complaint  Patient presents with  . Hand Pain    Rt hand 2nd    HPI  Jake Perkins is a 43 y.o. male who has a very small cyst on the right dominant hand volar side that bothers him when he cuts grass or holds a tool.  Otherwise, he has no pain, no swelling no redness.    The cyst is very small, about the size of a small grape seed.  It is not moveable.  It is not red.     Body mass index is 27.76 kg/m.  ROS  Review of Systems  Constitutional: Positive for activity change.  Musculoskeletal: Positive for arthralgias and joint swelling.  All other systems reviewed and are negative.   All other systems reviewed and are negative.  The following is a summary of the past history medically, past history surgically, known current medicines, social history and family history.  This information is gathered electronically by the computer from prior information and documentation.  I review this each visit and have found including this information at this point in the chart is beneficial and informative.    Past Medical History:  Diagnosis Date  . Diabetes mellitus     History reviewed. No pertinent surgical history.  Family History  Problem Relation Age of Onset  . Diabetes Mother   . Diabetes Father     Social History Social History   Tobacco Use  . Smoking status: Never Smoker  . Smokeless tobacco: Never Used  Substance Use Topics  . Alcohol use: No  . Drug use: No    Allergies  Allergen Reactions  . Jentadueto [Linagliptin-Metformin Hcl Er] Hives    Current Outpatient Medications  Medication Sig Dispense Refill  . Aspirin-Salicylamide-Caffeine (BC HEADACHE) 325-95-16 MG TABS Take 1 packet by mouth daily as needed (for pain).    . canagliflozin (INVOKANA) 100 MG TABS tablet Take 100 mg by mouth daily.    . Cetirizine HCl (ZYRTEC ALLERGY) 10 MG CAPS Take 1 capsule (10 mg total) by mouth  daily as needed (itching or rash). 14 capsule 0  . famotidine (PEPCID) 20 MG tablet Take 1 tablet (20 mg total) by mouth 2 (two) times daily. 20 tablet 0  . fish oil-omega-3 fatty acids 1000 MG capsule Take 1-3 g by mouth daily.     Marland Kitchen glipiZIDE (GLUCOTROL) 10 MG tablet Take 10 mg by mouth daily.     Marland Kitchen HYDROcodone-acetaminophen (NORCO/VICODIN) 5-325 MG tablet Take 1 tablet by mouth every 6 (six) hours as needed for severe pain. 10 tablet 0  . lisinopril (PRINIVIL,ZESTRIL) 5 MG tablet Take 5 mg by mouth at bedtime.     . metFORMIN (GLUCOPHAGE) 1000 MG tablet Take 1,000 mg by mouth 2 (two) times daily.    . predniSONE (DELTASONE) 20 MG tablet Take 2 po QD x 4d then 1 po QD x 4d 12 tablet 0   No current facility-administered medications for this visit.      Physical Exam  Blood pressure (!) 159/101, pulse 96, height 5\' 6"  (1.676 m), weight 172 lb (78 kg).  Constitutional: overall normal hygiene, normal nutrition, well developed, normal grooming, normal body habitus. Assistive device:none  Musculoskeletal: gait and station Limp none, muscle tone and strength are normal, no tremors or atrophy is present.  .  Neurological: coordination overall normal.  Deep tendon reflex/nerve stretch intact.  Sensation normal.  Cranial nerves II-XII intact.   Skin:  Normal overall no scars, lesions, ulcers or rashes. No psoriasis.  Psychiatric: Alert and oriented x 3.  Recent memory intact, remote memory unclear.  Normal mood and affect. Well groomed.  Good eye contact.  Cardiovascular: overall no swelling, no varicosities, no edema bilaterally, normal temperatures of the legs and arms, no clubbing, cyanosis and good capillary refill.  Lymphatic: palpation is normal.  Right hand has tenderness of the area just ulnar to the second metacarpal with a very small cystic lesion about the size of a small grape seed.  It is not moveable, it is not red, the palm is not swollen.  He has full ROM and the NV is  intact.  All other systems reviewed and are negative   The patient has been educated about the nature of the problem(s) and counseled on treatment options.  The patient appeared to understand what I have discussed and is in agreement with it.  Encounter Diagnosis  Name Primary?  . Pain of finger of right hand Yes    PLAN Call if any problems.  Precautions discussed.  Continue current medications.   Return to clinic as needed.   I have recommended giving the cyst a little time to see if it goes away or gets bigger. It could be removed surgically but he can see how he does. Often these small cysts go away their own.  If it gets more of a problem, he can call Dr. Aline Brochure and make appointment to be seen for possible surgery.  He is very agreeable to this.  Electronically Signed Sanjuana Kava, MD 6/30/202010:43 AM

## 2019-06-29 ENCOUNTER — Ambulatory Visit (INDEPENDENT_AMBULATORY_CARE_PROVIDER_SITE_OTHER): Payer: Managed Care, Other (non HMO) | Admitting: Orthopaedic Surgery

## 2019-06-29 ENCOUNTER — Encounter: Payer: Self-pay | Admitting: Orthopaedic Surgery

## 2019-06-29 ENCOUNTER — Other Ambulatory Visit: Payer: Self-pay

## 2019-06-29 ENCOUNTER — Encounter

## 2019-06-29 VITALS — BP 130/101 | HR 81 | Ht 66.0 in | Wt 168.0 lb

## 2019-06-29 DIAGNOSIS — M79641 Pain in right hand: Secondary | ICD-10-CM | POA: Diagnosis not present

## 2019-06-29 DIAGNOSIS — M79642 Pain in left hand: Secondary | ICD-10-CM | POA: Diagnosis not present

## 2019-06-29 DIAGNOSIS — G5603 Carpal tunnel syndrome, bilateral upper limbs: Secondary | ICD-10-CM | POA: Diagnosis not present

## 2019-06-29 DIAGNOSIS — R202 Paresthesia of skin: Secondary | ICD-10-CM | POA: Diagnosis not present

## 2019-06-29 NOTE — Progress Notes (Signed)
Patient [id number] Jake Perkins, male DOB:[birth year], 43 y.o. [id number]  Chief Complaint  Patient presents with  . Hand Pain    bilateral     HPI  Jake Perkins is a 43 y.o. male who has a small cyst on the volar right hand between the long and index fingers.  It is not hurting now but is still present.  He has developed carpal tunnel in both hands.  It has been present for years but has gotten worse recently.  He has no new trauma.  He has night paresthesias, dropping things, etc.   Body mass index is 27.12 kg/m.  ROS  Review of Systems  Constitutional: Positive for activity change.  Musculoskeletal: Positive for arthralgias and joint swelling.  All other systems reviewed and are negative.   All other systems reviewed and are negative.  The following is a summary of the past history medically, past history surgically, known current medicines, social history and family history.  This information is gathered electronically by the computer from prior information and documentation.  I review this each visit and have found including this information at this point in the chart is beneficial and informative.    Past Medical History:  Diagnosis Date  . Diabetes mellitus     History reviewed. No pertinent surgical history.  Family History  Problem Relation Age of Onset  . Diabetes Mother   . Diabetes Father     Social History Social History   Tobacco Use  . Smoking status: Never Smoker  . Smokeless tobacco: Never Used  Substance Use Topics  . Alcohol use: No  . Drug use: No    Allergies  Allergen Reactions  . Jentadueto [Linagliptin-Metformin Hcl Er] Hives    Current Outpatient Medications  Medication Sig Dispense Refill  . Aspirin-Salicylamide-Caffeine (BC HEADACHE) 325-95-16 MG TABS Take 1 packet by mouth daily as needed (for pain).    . canagliflozin (INVOKANA) 100 MG TABS tablet Take 100 mg by mouth daily.    . Cetirizine HCl (ZYRTEC ALLERGY) 10 MG CAPS Take 1  capsule (10 mg total) by mouth daily as needed (itching or rash). 14 capsule 0  . famotidine (PEPCID) 20 MG tablet Take 1 tablet (20 mg total) by mouth 2 (two) times daily. 20 tablet 0  . fish oil-omega-3 fatty acids 1000 MG capsule Take 1-3 g by mouth daily.     Marland Kitchen glipiZIDE (GLUCOTROL) 10 MG tablet Take 10 mg by mouth daily.     Marland Kitchen HYDROcodone-acetaminophen (NORCO/VICODIN) 5-325 MG tablet Take 1 tablet by mouth every 6 (six) hours as needed for severe pain. 10 tablet 0  . lisinopril (PRINIVIL,ZESTRIL) 5 MG tablet Take 5 mg by mouth at bedtime.     . metFORMIN (GLUCOPHAGE) 1000 MG tablet Take 1,000 mg by mouth 2 (two) times daily.    . predniSONE (DELTASONE) 20 MG tablet Take 2 po QD x 4d then 1 po QD x 4d 12 tablet 0   No current facility-administered medications for this visit.      Physical Exam  Blood pressure (!) 130/101, pulse 81, height 5\' 6"  (1.676 m), weight 168 lb (76.2 kg).  Constitutional: overall normal hygiene, normal nutrition, well developed, normal grooming, normal body habitus. Assistive device:none  Musculoskeletal: gait and station Limp none, muscle tone and strength are normal, no tremors or atrophy is present.  .  Neurological: coordination overall normal.  Deep tendon reflex/nerve stretch intact.  Sensation normal.  Cranial nerves II-XII intact.   Skin:  Normal overall no scars, lesions, ulcers or rashes. No psoriasis.  Psychiatric: Alert and oriented x 3.  Recent memory intact, remote memory unclear.  Normal mood and affect. Well groomed.  Good eye contact.  Cardiovascular: overall no swelling, no varicosities, no edema bilaterally, normal temperatures of the legs and arms, no clubbing, cyanosis and good capillary refill.  Lymphatic: palpation is normal.  He has positive bilateral Tinel and Phalen signs.  He has decreased sensation median nerve bilaterally.  Grips are good.  He has very small cyst of volar palm just ulnarward of the flexor tendon to the index  near the metacarpal head.    All other systems reviewed and are negative   The patient has been educated about the nature of the problem(s) and counseled on treatment options.  The patient appeared to understand what I have discussed and is in agreement with it.  Encounter Diagnoses  Name Primary?  . Paresthesia of skin Yes  . Bilateral hand pain   . Bilateral carpal tunnel syndrome     PLAN Call if any problems.  Precautions discussed.  Continue current medications.   Return to clinic 2 weeks   Get EMGs  Electronically Signed Sanjuana Kava, MD 9/1/20209:09 AM

## 2019-07-16 ENCOUNTER — Ambulatory Visit (INDEPENDENT_AMBULATORY_CARE_PROVIDER_SITE_OTHER): Payer: Managed Care, Other (non HMO) | Admitting: Physical Medicine and Rehabilitation

## 2019-07-16 ENCOUNTER — Encounter: Payer: Self-pay | Admitting: Physical Medicine and Rehabilitation

## 2019-07-16 DIAGNOSIS — R202 Paresthesia of skin: Secondary | ICD-10-CM

## 2019-07-16 NOTE — Progress Notes (Signed)
 .  Numeric Pain Rating Scale and Functional Assessment Average Pain 8   In the last MONTH (on 0-10 scale) has pain interfered with the following?  1. General activity like being  able to carry out your everyday physical activities such as walking, climbing stairs, carrying groceries, or moving a chair?  Rating(8)   

## 2019-07-16 NOTE — Progress Notes (Signed)
Jake Perkins - 43 y.o. male MRN [id number]  Date of birth: 1975/11/25  Office Visit Note: Visit Date: 07/16/2019 PCP: Ranae Palms, MD Referred by: Ranae Palms, MD  Subjective: Chief Complaint  Patient presents with  . Left Hand - Pain, Numbness, Tingling  . Right Hand - Pain, Numbness, Tingling  . Right Arm - Pain  . Left Arm - Pain   HPI: Jake Perkins is a 43 y.o. male who comes in today At the request of Dr. Sanjuana Kava for electrodiagnostic study of both upper limbs.  Patient is right-hand dominant and reports 6 months of continued chronic worsening pain numbness and tingling particularly in both hands index ring and middle finger.  He gets some referral up into the forearms.  He denies any frank radicular symptoms down the arms and no specific neck pain.  His case is complicated by type 2 diabetes which appears to be non-insulin-dependent but he reports is not very good control.  He says his A1c number is above 8.  I have any specific labs in the chart to review.  He denies any symptoms in the feet.  He does have worsening symptoms with moving and lifting his hands at work and doing things.  He has not had prior electrodiagnostic studies.  ROS Otherwise per HPI.  Assessment & Plan: Visit Diagnoses:  1. Paresthesia of skin     Plan: Impression: The above electrodiagnostic study is ABNORMAL and reveals evidence of a mild BILATERAL median nerve entrapment at the wrist (carpal tunnel syndrome) affecting sensory components.   There is no significant electrodiagnostic evidence of any other focal nerve entrapment, brachial plexopathy or cervical radiculopathy.   Recommendations: 1.  Follow-up with referring physician. 2.  Continue current management of symptoms.  Suggest possible diagnostic injection.  Suggest nighttime bracing.  Meds & Orders: No orders of the defined types were placed in this encounter.   Orders Placed This Encounter  Procedures  . NCV with EMG  (electromyography)    Follow-up: Return for Sanjuana Kava, MD.   Procedures: No procedures performed  EMG & NCV Findings: Evaluation of the left median (across palm) sensory and the right median (across palm) sensory nerves showed prolonged distal peak latency (Wrist, L4.1, R4.1 ms).  All remaining nerves (as indicated in the following tables) were within normal limits.  All left vs. right side differences were within normal limits.    All examined muscles (as indicated in the following table) showed no evidence of electrical instability.    Impression: The above electrodiagnostic study is ABNORMAL and reveals evidence of a mild BILATERAL median nerve entrapment at the wrist (carpal tunnel syndrome) affecting sensory components.   There is no significant electrodiagnostic evidence of any other focal nerve entrapment, brachial plexopathy or cervical radiculopathy.   Recommendations: 1.  Follow-up with referring physician. 2.  Continue current management of symptoms.  Suggest possible diagnostic injection.  Suggest nighttime bracing.  ___________________________ Laurence Spates FAAPMR Board Certified, American Board of Physical Medicine and Rehabilitation    Nerve Conduction Studies Anti Sensory Summary Table   Stim Site NR Peak (ms) Norm Peak (ms) P-T Amp (V) Norm P-T Amp Site1 Site2 Delta-P (ms) Dist (cm) Vel (m/s) Norm Vel (m/s)  Left Median Acr Palm Anti Sensory (2nd Digit)  32.5C  Wrist    *4.1 <3.6 23.0 >10 Wrist Palm 2.1 0.0    Palm    2.0 <2.0 22.1         Right Median  Acr Palm Anti Sensory (2nd Digit)  32.6C  Wrist    *4.1 <3.6 23.5 >10 Wrist Palm 2.1 0.0    Palm    2.0 <2.0 5.8         Right Radial Anti Sensory (Base 1st Digit)  31.9C  Wrist    2.4 <3.1 20.1  Wrist Base 1st Digit 2.4 0.0    Right Ulnar Anti Sensory (5th Digit)  32.5C  Wrist    3.3 <3.7 20.2 >15.0 Wrist 5th Digit 3.3 14.0 42 >38   Motor Summary Table   Stim Site NR Onset (ms) Norm Onset (ms) O-P Amp  (mV) Norm O-P Amp Site1 Site2 Delta-0 (ms) Dist (cm) Vel (m/s) Norm Vel (m/s)  Left Median Motor (Abd Poll Brev)  32.6C  Wrist    3.8 <4.2 9.0 >5 Elbow Wrist 4.9 25.0 51 >50  Elbow    8.7  8.5         Right Median Motor (Abd Poll Brev)  32C  Wrist    3.8 <4.2 7.0 >5 Elbow Wrist 4.6 24.0 52 >50  Elbow    8.4  6.7         Right Ulnar Motor (Abd Dig Min)  32.2C  Wrist    2.7 <4.2 9.2 >3 B Elbow Wrist 4.2 23.0 55 >53  B Elbow    6.9  9.8  A Elbow B Elbow 1.9 10.0 53 >53  A Elbow    8.8  9.5          EMG   Side Muscle Nerve Root Ins Act Fibs Psw Amp Dur Poly Recrt Int Fraser Din Comment  Right Abd Poll Brev Median C8-T1 Nml Nml Nml Nml Nml 0 Nml Nml   Right 1stDorInt Ulnar C8-T1 Nml Nml Nml Nml Nml 0 Nml Nml   Right PronatorTeres Median C6-7 Nml Nml Nml Nml Nml 0 Nml Nml   Right Biceps Musculocut C5-6 Nml Nml Nml Nml Nml 0 Nml Nml   Right Deltoid Axillary C5-6 Nml Nml Nml Nml Nml 0 Nml Nml     Nerve Conduction Studies Anti Sensory Left/Right Comparison   Stim Site L Lat (ms) R Lat (ms) L-R Lat (ms) L Amp (V) R Amp (V) L-R Amp (%) Site1 Site2 L Vel (m/s) R Vel (m/s) L-R Vel (m/s)  Median Acr Palm Anti Sensory (2nd Digit)  32.5C  Wrist *4.1 *4.1 0.0 23.0 23.5 2.1 Wrist Palm     Palm 2.0 2.0 0.0 22.1 5.8 73.8       Radial Anti Sensory (Base 1st Digit)  31.9C  Wrist  2.4   20.1  Wrist Base 1st Digit     Ulnar Anti Sensory (5th Digit)  32.5C  Wrist  3.3   20.2  Wrist 5th Digit  42    Motor Left/Right Comparison   Stim Site L Lat (ms) R Lat (ms) L-R Lat (ms) L Amp (mV) R Amp (mV) L-R Amp (%) Site1 Site2 L Vel (m/s) R Vel (m/s) L-R Vel (m/s)  Median Motor (Abd Poll Brev)  32.6C  Wrist 3.8 3.8 0.0 9.0 7.0 22.2 Elbow Wrist 51 52 1  Elbow 8.7 8.4 0.3 8.5 6.7 21.2       Ulnar Motor (Abd Dig Min)  32.2C  Wrist  2.7   9.2  B Elbow Wrist  55   B Elbow  6.9   9.8  A Elbow B Elbow  53   A Elbow  8.8   9.5  Waveforms:                Clinical History: No specialty  comments available.   He reports that he has never smoked. He has never used smokeless tobacco. No results for input(s): HGBA1C, LABURIC in the last 8760 hours.  Objective:  VS:  HT:    WT:   BMI:     BP:   HR: bpm  TEMP: ( )  RESP:  Physical Exam Musculoskeletal:        General: No tenderness.     Comments: Inspection reveals no atrophy of the bilateral APB or FDI or hand intrinsics. There is no swelling, color changes, allodynia or dystrophic changes. There is 5 out of 5 strength in the bilateral wrist extension, finger abduction and long finger flexion. There is intact sensation to light touch in all dermatomal and peripheral nerve distributions. There is a negative Hoffmann's test bilaterally.  Skin:    General: Skin is warm and dry.     Findings: No erythema or rash.  Neurological:     General: No focal deficit present.     Mental Status: He is alert and oriented to person, place, and time.     Sensory: No sensory deficit.     Motor: No weakness or abnormal muscle tone.     Coordination: Coordination normal.     Gait: Gait normal.  Psychiatric:        Mood and Affect: Mood normal.        Behavior: Behavior normal.        Thought Content: Thought content normal.     Ortho Exam Imaging: No results found.  Past Medical/Family/Surgical/Social History: Medications & Allergies reviewed per EMR, new medications updated. There are no active problems to display for this patient.  Past Medical History:  Diagnosis Date  . Diabetes mellitus    Family History  Problem Relation Age of Onset  . Diabetes Mother   . Diabetes Father    History reviewed. No pertinent surgical history. Social History   Occupational History  . Not on file  Tobacco Use  . Smoking status: Never Smoker  . Smokeless tobacco: Never Used  Substance and Sexual Activity  . Alcohol use: No  . Drug use: No  . Sexual activity: Yes

## 2019-07-19 NOTE — Procedures (Signed)
EMG & NCV Findings: Evaluation of the left median (across palm) sensory and the right median (across palm) sensory nerves showed prolonged distal peak latency (Wrist, L4.1, R4.1 ms).  All remaining nerves (as indicated in the following tables) were within normal limits.  All left vs. right side differences were within normal limits.    All examined muscles (as indicated in the following table) showed no evidence of electrical instability.    Impression: The above electrodiagnostic study is ABNORMAL and reveals evidence of a mild BILATERAL median nerve entrapment at the wrist (carpal tunnel syndrome) affecting sensory components.   There is no significant electrodiagnostic evidence of any other focal nerve entrapment, brachial plexopathy or cervical radiculopathy.   Recommendations: 1.  Follow-up with referring physician. 2.  Continue current management of symptoms.  Suggest possible diagnostic injection.  Suggest nighttime bracing.  ___________________________ Laurence Spates FAAPMR Board Certified, American Board of Physical Medicine and Rehabilitation    Nerve Conduction Studies Anti Sensory Summary Table   Stim Site NR Peak (ms) Norm Peak (ms) P-T Amp (V) Norm P-T Amp Site1 Site2 Delta-P (ms) Dist (cm) Vel (m/s) Norm Vel (m/s)  Left Median Acr Palm Anti Sensory (2nd Digit)  32.5C  Wrist    *4.1 <3.6 23.0 >10 Wrist Palm 2.1 0.0    Palm    2.0 <2.0 22.1         Right Median Acr Palm Anti Sensory (2nd Digit)  32.6C  Wrist    *4.1 <3.6 23.5 >10 Wrist Palm 2.1 0.0    Palm    2.0 <2.0 5.8         Right Radial Anti Sensory (Base 1st Digit)  31.9C  Wrist    2.4 <3.1 20.1  Wrist Base 1st Digit 2.4 0.0    Right Ulnar Anti Sensory (5th Digit)  32.5C  Wrist    3.3 <3.7 20.2 >15.0 Wrist 5th Digit 3.3 14.0 42 >38   Motor Summary Table   Stim Site NR Onset (ms) Norm Onset (ms) O-P Amp (mV) Norm O-P Amp Site1 Site2 Delta-0 (ms) Dist (cm) Vel (m/s) Norm Vel (m/s)  Left Median Motor (Abd Poll  Brev)  32.6C  Wrist    3.8 <4.2 9.0 >5 Elbow Wrist 4.9 25.0 51 >50  Elbow    8.7  8.5         Right Median Motor (Abd Poll Brev)  32C  Wrist    3.8 <4.2 7.0 >5 Elbow Wrist 4.6 24.0 52 >50  Elbow    8.4  6.7         Right Ulnar Motor (Abd Dig Min)  32.2C  Wrist    2.7 <4.2 9.2 >3 B Elbow Wrist 4.2 23.0 55 >53  B Elbow    6.9  9.8  A Elbow B Elbow 1.9 10.0 53 >53  A Elbow    8.8  9.5          EMG   Side Muscle Nerve Root Ins Act Fibs Psw Amp Dur Poly Recrt Int Fraser Din Comment  Right Abd Poll Brev Median C8-T1 Nml Nml Nml Nml Nml 0 Nml Nml   Right 1stDorInt Ulnar C8-T1 Nml Nml Nml Nml Nml 0 Nml Nml   Right PronatorTeres Median C6-7 Nml Nml Nml Nml Nml 0 Nml Nml   Right Biceps Musculocut C5-6 Nml Nml Nml Nml Nml 0 Nml Nml   Right Deltoid Axillary C5-6 Nml Nml Nml Nml Nml 0 Nml Nml     Nerve Conduction Studies Anti Sensory  Left/Right Comparison   Stim Site L Lat (ms) R Lat (ms) L-R Lat (ms) L Amp (V) R Amp (V) L-R Amp (%) Site1 Site2 L Vel (m/s) R Vel (m/s) L-R Vel (m/s)  Median Acr Palm Anti Sensory (2nd Digit)  32.5C  Wrist *4.1 *4.1 0.0 23.0 23.5 2.1 Wrist Palm     Palm 2.0 2.0 0.0 22.1 5.8 73.8       Radial Anti Sensory (Base 1st Digit)  31.9C  Wrist  2.4   20.1  Wrist Base 1st Digit     Ulnar Anti Sensory (5th Digit)  32.5C  Wrist  3.3   20.2  Wrist 5th Digit  42    Motor Left/Right Comparison   Stim Site L Lat (ms) R Lat (ms) L-R Lat (ms) L Amp (mV) R Amp (mV) L-R Amp (%) Site1 Site2 L Vel (m/s) R Vel (m/s) L-R Vel (m/s)  Median Motor (Abd Poll Brev)  32.6C  Wrist 3.8 3.8 0.0 9.0 7.0 22.2 Elbow Wrist 51 52 1  Elbow 8.7 8.4 0.3 8.5 6.7 21.2       Ulnar Motor (Abd Dig Min)  32.2C  Wrist  2.7   9.2  B Elbow Wrist  55   B Elbow  6.9   9.8  A Elbow B Elbow  53   A Elbow  8.8   9.5           Waveforms:

## 2019-08-12 ENCOUNTER — Ambulatory Visit (INDEPENDENT_AMBULATORY_CARE_PROVIDER_SITE_OTHER): Payer: Managed Care, Other (non HMO) | Admitting: Orthopaedic Surgery

## 2019-08-12 ENCOUNTER — Encounter: Payer: Self-pay | Admitting: Orthopaedic Surgery

## 2019-08-12 ENCOUNTER — Other Ambulatory Visit: Payer: Self-pay

## 2019-08-12 VITALS — BP 130/95 | HR 85 | Ht 66.0 in | Wt 168.0 lb

## 2019-08-12 DIAGNOSIS — G5603 Carpal tunnel syndrome, bilateral upper limbs: Secondary | ICD-10-CM | POA: Diagnosis not present

## 2019-08-12 NOTE — Progress Notes (Signed)
Patient Jake Perkins Jake Perkins, male DOB:09-20-1976, 43 y.o. [id number]  Chief Complaint  Patient presents with  . Hand Pain    HPI  Jake Perkins is a 43 y.o. male who has bilateral hand pain and numbness.  He had EMGs showing bilateral carpal tunnel.  He would like to have surgery before the end of the year. I will have him see Dr. Aline Brochure.   Body mass index is 27.12 kg/m.  ROS  Review of Systems  Constitutional: Positive for activity change.  Musculoskeletal: Positive for arthralgias and joint swelling.  All other systems reviewed and are negative.   All other systems reviewed and are negative.  The following is a summary of the past history medically, past history surgically, known current medicines, social history and family history.  This information is gathered electronically by the computer from prior information and documentation.  I review this each visit and have found including this information at this point in the chart is beneficial and informative.    Past Medical History:  Diagnosis Date  . Diabetes mellitus     History reviewed. No pertinent surgical history.  Family History  Problem Relation Age of Onset  . Diabetes Mother   . Diabetes Father     Social History Social History   Tobacco Use  . Smoking status: Never Smoker  . Smokeless tobacco: Never Used  Substance Use Topics  . Alcohol use: No  . Drug use: No    Allergies  Allergen Reactions  . Jentadueto [Linagliptin-Metformin Hcl Er] Hives    Current Outpatient Medications  Medication Sig Dispense Refill  . Aspirin-Salicylamide-Caffeine (BC HEADACHE) 325-95-16 MG TABS Take 1 packet by mouth daily as needed (for pain).    . canagliflozin (INVOKANA) 100 MG TABS tablet Take 100 mg by mouth daily.    . Cetirizine HCl (ZYRTEC ALLERGY) 10 MG CAPS Take 1 capsule (10 mg total) by mouth daily as needed (itching or rash). 14 capsule 0  . famotidine (PEPCID) 20 MG tablet Take 1 tablet (20 mg total)  by mouth 2 (two) times daily. 20 tablet 0  . fish oil-omega-3 fatty acids 1000 MG capsule Take 1-3 g by mouth daily.     Marland Kitchen glipiZIDE (GLUCOTROL) 10 MG tablet Take 10 mg by mouth daily.     Marland Kitchen HYDROcodone-acetaminophen (NORCO/VICODIN) 5-325 MG tablet Take 1 tablet by mouth every 6 (six) hours as needed for severe pain. 10 tablet 0  . lisinopril (PRINIVIL,ZESTRIL) 5 MG tablet Take 5 mg by mouth at bedtime.     . metFORMIN (GLUCOPHAGE) 1000 MG tablet Take 1,000 mg by mouth 2 (two) times daily.    . predniSONE (DELTASONE) 20 MG tablet Take 2 po QD x 4d then 1 po QD x 4d 12 tablet 0   No current facility-administered medications for this visit.      Physical Exam  Blood pressure (!) 130/95, pulse 85, height 5\' 6"  (1.676 m), weight 168 lb (76.2 kg).  Constitutional: overall normal hygiene, normal nutrition, well developed, normal grooming, normal body habitus. Assistive device:none  Musculoskeletal: gait and station Limp none, muscle tone and strength are normal, no tremors or atrophy is present.  .  Neurological: coordination overall normal.  Deep tendon reflex/nerve stretch intact.  Sensation normal.  Cranial nerves II-XII intact.   Skin:   Normal overall no scars, lesions, ulcers or rashes. No psoriasis.  Psychiatric: Alert and oriented x 3.  Recent memory intact, remote memory unclear.  Normal mood and affect. Well groomed.  Good eye contact.  Cardiovascular: overall no swelling, no varicosities, no edema bilaterally, normal temperatures of the legs and arms, no clubbing, cyanosis and good capillary refill.  Lymphatic: palpation is normal.  He has positive Phalen both hands and decreased sensation in median nerve distribution bilaterally.    All other systems reviewed and are negative   The patient has been educated about the nature of the problem(s) and counseled on treatment options.  The patient appeared to understand what I have discussed and is in agreement with it.  Encounter  Diagnosis  Name Primary?  . Bilateral carpal tunnel syndrome Yes    PLAN Call if any problems.  Precautions discussed.  Continue current medications.   Return to clinic to see Dr. Aline Brochure.   Electronically Signed Sanjuana Kava, MD 10/15/20209:27 AM

## 2019-08-24 ENCOUNTER — Encounter: Payer: Self-pay | Admitting: Orthopedic Surgery

## 2019-08-24 ENCOUNTER — Other Ambulatory Visit: Payer: Self-pay

## 2019-08-24 ENCOUNTER — Ambulatory Visit (INDEPENDENT_AMBULATORY_CARE_PROVIDER_SITE_OTHER): Payer: Managed Care, Other (non HMO) | Admitting: Orthopedic Surgery

## 2019-08-24 VITALS — BP 109/76 | HR 102 | Temp 97.7°F | Ht 66.0 in | Wt 168.0 lb

## 2019-08-24 DIAGNOSIS — G5603 Carpal tunnel syndrome, bilateral upper limbs: Secondary | ICD-10-CM

## 2019-08-24 NOTE — Progress Notes (Signed)
Patient ID: Jake Perkins, male   DOB: 09/05/1976, 43 y.o.   MRN: [id number]  Chief Complaint  Patient presents with  . Wrist Pain    Bilat wrist pain     HPI ERIE [name] is a 43 y.o. male.  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  He now has trouble exercising including doing any push-ups or any lifting.  His symptoms initially began with cramping and then progressed to have aching across the wrist and up into the arm although he modified activities he did not have any improvement  He eventually sought medical management and nerve conduction studies were obtained and they showed he had carpal tunnel syndrome   Review of Systems Review of Systems  Constitutional: Negative for activity change and fever.  HENT: Negative for congestion.   Eyes: Negative.   Respiratory: Negative for shortness of breath and wheezing.   Cardiovascular: Negative.   All other systems reviewed and are negative.    Past Medical History:  Diagnosis Date  . Diabetes mellitus     No past surgical history on file.  Family History  Problem Relation Age of Onset  . Diabetes Mother   . Diabetes Father      Social History   Tobacco Use  . Smoking status: Never Smoker  . Smokeless tobacco: Never Used  Substance Use Topics  . Alcohol use: No  . Drug use: No    Allergies  Allergen Reactions  . Jentadueto [Linagliptin-Metformin Hcl Er] Hives    Current Outpatient Medications  Medication Sig Dispense Refill  . Dapagliflozin Propanediol (FARXIGA PO) Take by mouth.    . diclofenac sodium (VOLTAREN) 1 % GEL Apply topically 4 (four) times daily.    . fish oil-omega-3 fatty acids 1000 MG capsule Take 1-3 g by mouth daily.     . metFORMIN (GLUCOPHAGE) 1000 MG tablet Take 1,000 mg by mouth 2 (two) times daily.     No current facility-administered medications for this visit.       Physical Exam BP 109/76   Pulse (!) 102   Temp 97.7 F (36.5 C)   Ht 5\' 6"  (1.676 m)   Wt 168 lb (76.2 kg)   BMI 27.12 kg/m  Physical Exam The patient is well developed well nourished and well groomed.  Orientation to person place and time is normal  Mood is pleasant.  Ambulatory status normal gait and stance   Left upper extremity examination reveals the following:  Inspection reveals no swelling. There is tenderness over the carpal tunnel.  And distal forearm  Range of motion of the wrist and elbow are normal  Motor exam shows mild weakness with grip strength test  Wrist joint is stable  Provocative tests for carpal tunnel Phalen's test positive Carpal tunnel compression test positive Tinel's test negative  On the right side he is less symptomatic there is no swelling there is no tenderness over the carpal tunnel to motion is normal to grip strength is excellent the wrist joint is stable to provocative tests are negative  Left and right hand wrist pulses are normal in the radial and ulnar artery with a normal Allen's test.  Decreased sensation is noted in the median nerve distribution right and left hand. Soft touch is normal.   Plan     MEDICAL DECISION SECTION  Carpal tunnel nerve conduction study:  EMG & NCV Findings: Evaluation of the left median (across palm) sensory and the right median (across palm) sensory nerves showed prolonged distal peak latency (Wrist, L4.1, R4.1 ms).  All remaining nerves (as indicated in the following tables) were within normal limits.  All left vs. right side differences were within normal limits.     All examined muscles (as indicated in the following table) showed no evidence of electrical instability.     Impression: The above electrodiagnostic study is ABNORMAL and reveals evidence of a mild BILATERAL median nerve entrapment at the wrist (carpal tunnel syndrome) affecting sensory components.    There is no significant  electrodiagnostic evidence of any other focal nerve entrapment, brachial plexopathy or cervical radiculopathy.    Encounter Diagnosis  Name Primary?  . Bilateral carpal tunnel syndrome Yes     PLAN: After much discussion including a discussion of using medical management and/or injection he wishes to proceed with sequential carpal tunnel releases starting with the left side in November and then the right side in December  We will proceed with a left open carpal tunnel release  No orders of the defined types were placed in this encounter.

## 2019-08-24 NOTE — Patient Instructions (Signed)
Open Carpal Tunnel Release    Open carpal tunnel release is a surgery to relieve symptoms caused by carpal tunnel syndrome. The carpal tunnel is a narrow, hollow space in the wrist. It is located between the wrist bones and a band of connective tissue (transverse carpal ligament, also known as the flexor retinaculum). The nerve that supplies most of the hand (median nerve) passes through the carpal tunnel, and so do tissues that connect bones to muscles (tendons) in the hand and arm. Carpal tunnel syndrome makes this space swell and become narrow. The swelling pinches the median nerve and causes pain and numbness.  During carpal tunnel release surgery, the transverse carpal ligament is cut to make more room in the carpal tunnel space. This also lessens the pressure on the median nerve. You may have this surgery if other types of treatment have not relieved your carpal tunnel symptoms. This surgery is usually done only for the hand that you use more often (dominant hand), but it may be done for both hands depending on your symptoms.  Tell a health care provider about:  · Any allergies you have.  · All medicines you are taking, including vitamins, herbs, eye drops, creams, and over-the-counter medicines.  · Any problems you or family members have had with anesthetic medicines.  · Any blood disorders you have.  · Any surgeries you have had.  · Any medical conditions you have.  · Whether you are pregnant or may be pregnant.  What are the risks?  Generally, this is a safe procedure. However, problems may occur, including:  · Infection.  · Bleeding.  · Injury to the median nerve.  · Allergic reactions to medicines.  · The surgery failing to relieve your symptoms, or making your symptoms worse.  What happens before the procedure?  Medicines  · Ask your health care provider about:  ? Changing or stopping your regular medicines. This is especially important if you are taking diabetes medicines or blood thinners.  ? Taking  medicines such as aspirin and ibuprofen. These medicines can thin your blood. Do not take these medicines unless your health care provider tells you to take them.  ? Taking over-the-counter medicines, vitamins, herbs, and supplements.  · You may be given antibiotic medicine to help prevent infection.  Staying hydrated  Follow instructions from your health care provider about hydration, which may include:  · Up to 2 hours before the procedure - you may continue to drink clear liquids, such as water, clear fruit juice, black coffee, and plain tea.  Eating and drinking restrictions  Follow instructions from your health care provider about eating and drinking, which may include:  · 8 hours before the procedure - stop eating heavy meals or foods such as meat, fried foods, or fatty foods.  · 6 hours before the procedure - stop eating light meals or foods, such as toast or cereal.  · 6 hours before the procedure - stop drinking milk or drinks that contain milk.  · 2 hours before the procedure - stop drinking clear liquids.  General instructions  · Ask your health care provider how your surgical site will be marked or identified.  · You may be asked to shower with a germ-killing soap.  · Plan to have someone take you home from the hospital or clinic.  · Plan to have a responsible adult care for you for at least 24 hours after you leave the hospital or clinic. This is important.  What happens   a germ-killing (antiseptic) solution.  An IV will be inserted into one of your veins.  You will be given one of the following: ? A medicine to numb the wrist area (local anesthetic). You may also be given a medicine to help you relax (sedative). ? A medicine to make you fall asleep (general anesthetic).  An incision  will be made in your wrist, on the same side as your palm.  The skin of your wrist will be spread to expose the transverse carpal ligament.  The transverse carpal ligament will be cut to make more room in the carpal tunnel space.  Your incision will be closed with stitches (sutures) or staples.  A bandage (dressing) will be placed over your wrist and wrapped around your hand and wrist. The procedure may vary among health care providers and hospitals. What happens after the procedure?  Your blood pressure, heart rate, breathing rate, and blood oxygen level will be monitored until the medicines you were given have worn off.  You will be given pain medicine as needed.  A splint or brace may be placed over your dressing, to hold your hand and wrist in place while you heal.  Do not drive until your health care provider approves. Summary  Carpal tunnel release is a surgery to relieve pain and numbness in the hand caused by swelling around a nerve (carpal tunnel syndrome).  You may have this surgery if other types of treatment have not relieved your carpal tunnel symptoms.  During carpal tunnel release surgery, a band of connective tissue (transverse carpal ligament) is cut to make more room in the carpal tunnel space. This information is not intended to replace advice given to you by your health care provider. Make sure you discuss any questions you have with your health care provider. Document Released: 01/04/2004 Document Revised: 09/26/2017 Document Reviewed: 06/23/2017 Elsevier Patient Education  2020 [employer].

## 2019-09-03 NOTE — Patient Instructions (Signed)
Jake Perkins  09/03/2019     @PREFPERIOPPHARMACY @   Your procedure is scheduled on  09/09/2019 .  Report to [name] at  [phone]   A.M.  Call this number if you have problems the morning of surgery:  [phone]   Remember:  Do not eat or drink after midnight.                         Take these medicines the morning of surgery with A SIP OF WATER  None. DO NOT take any medication for diabetes the morning of your procedure.    Do not wear jewelry, make-up or nail polish.  Do not wear lotions, powders, or perfumes. Please wear deodorant and brush your teeth.  Do not shave 48 hours prior to surgery.  Men may shave face and neck.  Do not bring valuables to the hospital.  [facility] is not responsible for any belongings or valuables.  Contacts, dentures or bridgework may not be worn into surgery.  Leave your suitcase in the car.  After surgery it may be brought to your room.  For patients admitted to the hospital, discharge time will be determined by your treatment team.  Patients discharged the day of surgery will not be allowed to drive home.   Name and phone number of your driver:   family Special instructions:  None  Please read over the following fact sheets that you were given. Anesthesia Post-op Instructions and Care and Recovery After Surgery       Open Carpal Tunnel Release, Care After This sheet gives you information about how to care for yourself after your procedure. Your health care provider may also give you more specific instructions. If you have problems or questions, contact your health care provider. What can I expect after the procedure? After the procedure, it is common to have:  Wrist stiffness.  Bruising. Follow these instructions at home: Bathing  Do not take baths, swim, or use a hot tub until your health care provider approves. Ask your health care provider if you may take showers.  Keep your bandage (dressing) dry until your health  care provider says it can be removed. If you have a splint or brace:  Wear the splint or brace as told by your health care provider. You may need to wear it for 2-3 weeks. Remove it only as told by your health care provider.  Loosen the splint or brace if your fingers tingle, become numb, or turn cold and blue.  Keep the splint or brace clean.  If the splint or brace is not waterproof: ? Do not let it get wet. ? Cover it with a watertight covering when you take a bath or a shower. Incision care   Follow instructions from your health care provider about how to take care of your incision. Make sure you: ? Wash your hands with soap and water before you change your dressing. If soap and water are not available, use hand sanitizer. ? Change your dressing as told by your health care provider. ? Leave stitches (sutures), skin glue, or adhesive strips in place. These skin closures may need to stay in place for 2 weeks or longer. If adhesive strip edges start to loosen and curl up, you may trim the loose edges. Do not remove adhesive strips completely unless your health care provider tells you to do that.  Check your incision area every day  for signs of infection. Check for: ? Redness, swelling, or pain. ? Fluid or blood. ? Warmth. ? Pus or a bad smell. Managing pain, stiffness, and swelling   If directed, put ice on the affected area. ? If you have a removable splint or brace, remove it as told by your health care provider. ? Put ice in a plastic bag. ? Place a towel between your skin and the bag. ? Leave the ice on for 20 minutes, 2-3 times a day.  Move your fingers often to avoid stiffness and to lessen swelling.  Raise (elevate) your wrist above the level of your heart while you are sitting or lying down. Activity  Do not drive until your health care provider approves.  Do not drive or use heavy machinery while taking prescription pain medicine.  Return to your normal  activities as told by your health care provider. Avoid activities that cause pain.  If physical therapy was prescribed, do exercises as told by your therapist. Physical therapy can help you heal faster and regain movement. General instructions  Take over-the-counter and prescription medicines only as told by your health care provider.  If you are taking prescription pain medicine, take actions to prevent or treat constipation. Your health care provider may recommend that you: ? Drink enough fluid to keep your urine pale yellow. ? Eat foods that are high in fiber, such as fresh fruits and vegetables, whole grains, and beans. ? Limit foods that are high in fat and processed sugars, such as fried or sweet foods. ? Take an over-the-counter or prescription medicine for constipation.  Do not use any products that contain nicotine or tobacco, such as cigarettes and e-cigarettes. If you need help quitting, ask your health care provider.  Keep all follow-up visits as told by your health care provider and physical therapist. This is important. Contact a health care provider if:  You have redness or swelling around your incision.  You have fluid or blood coming from your incision.  Your incision feels warm to the touch.  You have pus or a bad smell coming from your incision.  You have a fever.  You have chills.  You have pain that does not get better with medicine.  Your carpal tunnel symptoms do not go away after 2 months.  Your carpal tunnel symptoms go away and then come back. Get help right away if:  You have pain or numbness that is getting worse.  Your fingers or fingertips become very pale or bluish in color.  You are not able to move your fingers. Summary  It is common to have wrist stiffness and bruising after a carpal tunnel release.  Icing and raising (elevating) your wrist may help to lessen swelling and pain.  Call your health care provider if you have a fever or  notice any signs of infection in your incision area. This information is not intended to replace advice given to you by your health care provider. Make sure you discuss any questions you have with your health care provider. Document Released: 05/03/2005 Document Revised: 09/26/2017 Document Reviewed: 06/23/2017 Elsevier Patient Education  2020 [place] After These instructions provide you with information about caring for yourself after your procedure. Your health care provider may also give you more specific instructions. Your treatment has been planned according to current medical practices, but problems sometimes occur. Call your health care provider if you have any problems or questions after your procedure. What can I  expect after the procedure? After your procedure, you may:  Feel sleepy for several hours.  Feel clumsy and have poor balance for several hours.  Feel forgetful about what happened after the procedure.  Have poor judgment for several hours.  Feel nauseous or vomit.  Have a sore throat if you had a breathing tube during the procedure. Follow these instructions at home: For at least 24 hours after the procedure:      Have a responsible adult stay with you. It is important to have someone help care for you until you are awake and alert.  Rest as needed.  Do not: ? Participate in activities in which you could fall or become injured. ? Drive. ? Use heavy machinery. ? Drink alcohol. ? Take sleeping pills or medicines that cause drowsiness. ? Make important decisions or sign legal documents. ? Take care of children on your own. Eating and drinking  Follow the diet that is recommended by your health care provider.  If you vomit, drink water, juice, or soup when you can drink without vomiting.  Make sure you have little or no nausea before eating solid foods. General instructions  Take over-the-counter and prescription  medicines only as told by your health care provider.  If you have sleep apnea, surgery and certain medicines can increase your risk for breathing problems. Follow instructions from your health care provider about wearing your sleep device: ? Anytime you are sleeping, including during daytime naps. ? While taking prescription pain medicines, sleeping medicines, or medicines that make you drowsy.  If you smoke, do not smoke without supervision.  Keep all follow-up visits as told by your health care provider. This is important. Contact a health care provider if:  You keep feeling nauseous or you keep vomiting.  You feel light-headed.  You develop a rash.  You have a fever. Get help right away if:  You have trouble breathing. Summary  For several hours after your procedure, you may feel sleepy and have poor judgment.  Have a responsible adult stay with you for at least 24 hours or until you are awake and alert. This information is not intended to replace advice given to you by your health care provider. Make sure you discuss any questions you have with your health care provider. Document Released: 02/04/2016 Document Revised: 01/12/2018 Document Reviewed: 02/04/2016 Elsevier Patient Education  2020 [employer].

## 2019-09-07 ENCOUNTER — Other Ambulatory Visit (HOSPITAL_COMMUNITY)
Admission: RE | Admit: 2019-09-07 | Discharge: 2019-09-07 | Disposition: A | Discharge status: Home / Self Care | Payer: Managed Care, Other (non HMO) | Source: Ambulatory Visit | Attending: Orthopedic Surgery | Admitting: Orthopedic Surgery

## 2019-09-07 ENCOUNTER — Other Ambulatory Visit: Payer: Self-pay

## 2019-09-07 ENCOUNTER — Telehealth: Payer: Self-pay | Admitting: Radiology

## 2019-09-07 ENCOUNTER — Encounter (HOSPITAL_COMMUNITY)
Admission: RE | Admit: 2019-09-07 | Discharge: 2019-09-07 | Disposition: A | Discharge status: Home / Self Care | Payer: Managed Care, Other (non HMO) | Source: Ambulatory Visit | Attending: Orthopedic Surgery | Admitting: Orthopedic Surgery

## 2019-09-07 ENCOUNTER — Encounter (HOSPITAL_COMMUNITY): Payer: Self-pay

## 2019-09-07 DIAGNOSIS — Z01812 Encounter for preprocedural laboratory examination: Secondary | ICD-10-CM | POA: Insufficient documentation

## 2019-09-07 HISTORY — DX: Carpal tunnel syndrome, bilateral upper limbs: G56.03

## 2019-09-07 LAB — CBC WITH DIFFERENTIAL/PLATELET
Abs Immature Granulocytes: 0 10*3/uL (ref 0.00–0.07)
Basophils Absolute: 0 10*3/uL (ref 0.0–0.1)
Basophils Relative: 1 %
Eosinophils Absolute: 0.1 10*3/uL (ref 0.0–0.5)
Eosinophils Relative: 3 %
HCT: 41.4 % (ref 39.0–52.0)
Hemoglobin: 13.2 g/dL (ref 13.0–17.0)
Immature Granulocytes: 0 %
Lymphocytes Relative: 49 %
Lymphs Abs: 2.4 10*3/uL (ref 0.7–4.0)
MCH: 27.9 pg (ref 26.0–34.0)
MCHC: 31.9 g/dL (ref 30.0–36.0)
MCV: 87.5 fL (ref 80.0–100.0)
Monocytes Absolute: 0.3 10*3/uL (ref 0.1–1.0)
Monocytes Relative: 6 %
Neutro Abs: 2 10*3/uL (ref 1.7–7.7)
Neutrophils Relative %: 41 %
Platelets: 224 10*3/uL (ref 150–400)
RBC: 4.73 MIL/uL (ref 4.22–5.81)
RDW: 12.8 % (ref 11.5–15.5)
WBC: 4.8 10*3/uL (ref 4.0–10.5)
nRBC: 0 % (ref 0.0–0.2)

## 2019-09-07 LAB — BASIC METABOLIC PANEL
Anion gap: 8 (ref 5–15)
BUN: 15 mg/dL (ref 6–20)
CO2: 23 mmol/L (ref 22–32)
Calcium: 9.1 mg/dL (ref 8.9–10.3)
Chloride: 107 mmol/L (ref 98–111)
Creatinine, Ser: 0.65 mg/dL (ref 0.61–1.24)
GFR calc Af Amer: >60 mL/min (ref >60)
GFR calc non Af Amer: >60 mL/min (ref >60)
Glucose, Bld: 148 mg/dL — ABNORMAL HIGH (ref 70–99)
Potassium: 3.8 mmol/L (ref 3.5–5.1)
Sodium: 138 mmol/L (ref 135–145)

## 2019-09-07 LAB — SARS CORONAVIRUS 2 (TAT 6-24 HRS): SARS Coronavirus 2: NEGATIVE

## 2019-09-07 NOTE — Telephone Encounter (Signed)
Per recording automated system for [employer] authorization is not required for the Carpal tunnel release, CPT code [phone] confirmation number for this is [phone]

## 2019-09-08 NOTE — H&P (Signed)
Patient ID: Jake Perkins, male   DOB: [birth year], 43 y.o.   MRN: [id number]       Chief Complaint  Patient presents with  . Wrist Pain      Bilat wrist pain       HPI Jake Perkins is a 43 y.o. male.  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  He now has trouble exercising including doing any push-ups or any lifting.   His symptoms initially began with cramping and then progressed to have aching across the wrist and up into the arm although he modified activities he did not have any improvement   He eventually sought medical management and nerve conduction studies were obtained and they showed he had carpal tunnel syndrome     Review of Systems Review of Systems  Constitutional: Negative for activity change and fever.  HENT: Negative for congestion.   Eyes: Negative.   Respiratory: Negative for shortness of breath and wheezing.   Cardiovascular: Negative.   All other systems reviewed and are negative.           Past Medical History:  Diagnosis Date  . Diabetes mellitus        No past surgical history on file.        Family History  Problem Relation Age of Onset  . Diabetes Mother    . Diabetes Father          Social History        Tobacco Use  . Smoking status: Never Smoker  . Smokeless tobacco: Never Used  Substance Use Topics  . Alcohol use: No  . Drug use: No          Allergies  Allergen Reactions  . Jentadueto [Linagliptin-Metformin Hcl Er] Hives            Current Outpatient Medications  Medication Sig Dispense Refill  . Dapagliflozin Propanediol (FARXIGA PO) Take by mouth.      . diclofenac sodium (VOLTAREN) 1 % GEL Apply topically 4 (four) times daily.      . fish oil-omega-3 fatty acids 1000 MG capsule Take 1-3 g by mouth daily.       . metFORMIN (GLUCOPHAGE) 1000 MG tablet Take 1,000 mg by mouth 2 (two) times daily.         No current facility-administered medications for this visit.         Physical Exam BP 109/76   Pulse (!) 102   Temp 97.7 F (36.5 C)   Ht 5\' 6"  (1.676 m)   Wt 168 lb (76.2 kg)   BMI 27.12 kg/m  Physical Exam The patient is well developed well nourished and well groomed.  Orientation to person place and time is normal  Mood is pleasant.   Ambulatory status normal gait and stance    Left upper extremity examination reveals the following:   Inspection reveals no swelling. There is tenderness over the carpal tunnel.  And distal forearm   Range of motion of the wrist and elbow are normal   Motor exam shows mild weakness with grip strength test   Wrist joint is stable   Provocative tests for carpal tunnel Phalen's test positive Carpal tunnel compression test positive Tinel's test negative   On the right side he is less symptomatic there is no swelling there is no tenderness over the carpal tunnel to  motion is normal to grip strength is excellent the wrist joint is stable to provocative tests are negative   Left and right hand wrist pulses are normal in the radial and ulnar artery with a normal Allen's test.   Decreased sensation is noted in the median nerve distribution right and left hand. Soft touch is normal.     Plan       MEDICAL DECISION SECTION  Carpal tunnel nerve conduction study:    EMG & NCV Findings: Evaluation of the left median (across palm) sensory and the right median (across palm) sensory nerves showed prolonged distal peak latency (Wrist, L4.1, R4.1 ms).  All remaining nerves (as indicated in the following tables) were within normal limits.  All left vs. right side differences were within normal limits.     All examined muscles (as indicated in the following table) showed no evidence of electrical instability.     Impression: The above electrodiagnostic study is ABNORMAL and reveals evidence of a mild BILATERAL median nerve entrapment at the wrist  (carpal tunnel syndrome) affecting sensory components.    There is no significant electrodiagnostic evidence of any other focal nerve entrapment, brachial plexopathy or cervical radiculopathy.      Encounter Diagnosis  Name Primary?  . Bilateral carpal tunnel syndrome Yes        PLAN: After much discussion including a discussion of using medical management and/or injection he wishes to proceed with sequential carpal tunnel releases starting with the left side in November and then the right side in December   We will proceed with a left open carpal tunnel release

## 2019-09-09 ENCOUNTER — Ambulatory Visit (HOSPITAL_COMMUNITY): Payer: Managed Care, Other (non HMO) | Admitting: Anesthesiology

## 2019-09-09 ENCOUNTER — Ambulatory Visit (HOSPITAL_COMMUNITY)
Admission: RE | Admit: 2019-09-09 | Discharge: 2019-09-09 | Disposition: A | Discharge status: Home / Self Care | Payer: Managed Care, Other (non HMO) | Attending: Orthopedic Surgery | Admitting: Orthopedic Surgery

## 2019-09-09 ENCOUNTER — Encounter (HOSPITAL_COMMUNITY)
Admission: RE | Disposition: A | Discharge status: Home / Self Care | Payer: Self-pay | Source: Home / Self Care | Attending: Orthopedic Surgery

## 2019-09-09 DIAGNOSIS — Z7984 Long term (current) use of oral hypoglycemic drugs: Secondary | ICD-10-CM | POA: Diagnosis not present

## 2019-09-09 DIAGNOSIS — Z79899 Other long term (current) drug therapy: Secondary | ICD-10-CM | POA: Insufficient documentation

## 2019-09-09 DIAGNOSIS — G5603 Carpal tunnel syndrome, bilateral upper limbs: Secondary | ICD-10-CM | POA: Insufficient documentation

## 2019-09-09 DIAGNOSIS — G5602 Carpal tunnel syndrome, left upper limb: Secondary | ICD-10-CM

## 2019-09-09 DIAGNOSIS — E119 Type 2 diabetes mellitus without complications: Secondary | ICD-10-CM | POA: Insufficient documentation

## 2019-09-09 DIAGNOSIS — Z833 Family history of diabetes mellitus: Secondary | ICD-10-CM | POA: Diagnosis not present

## 2019-09-09 HISTORY — PX: CARPAL TUNNEL RELEASE: SHX101

## 2019-09-09 LAB — GLUCOSE, CAPILLARY: Glucose-Capillary: 130 mg/dL — ABNORMAL HIGH (ref 70–99)

## 2019-09-09 SURGERY — CARPAL TUNNEL RELEASE
Anesthesia: Monitor Anesthesia Care | Laterality: Left

## 2019-09-09 MED ORDER — LIDOCAINE HCL (PF) 0.5 % IJ SOLN
INTRAMUSCULAR | Status: DC | PRN
  Administered 2019-09-09: 50 mL via INTRAVENOUS

## 2019-09-09 MED ORDER — BUPIVACAINE HCL (PF) 0.5 % IJ SOLN
INTRAMUSCULAR | Status: DC | PRN
  Administered 2019-09-09: 10 mL

## 2019-09-09 MED ORDER — ACETAMINOPHEN 500 MG PO TABS
500.0000 mg | ORAL_TABLET | Freq: Once | ORAL | Status: AC
  Administered 2019-09-09: 500 mg via ORAL
  Filled 2019-09-09: qty 1

## 2019-09-09 MED ORDER — BUPIVACAINE HCL (PF) 0.5 % IJ SOLN
INTRAMUSCULAR | Status: AC
  Filled 2019-09-09: qty 30

## 2019-09-09 MED ORDER — CEFAZOLIN SODIUM-DEXTROSE 2-4 GM/100ML-% IV SOLN
INTRAVENOUS | Status: AC
  Filled 2019-09-09: qty 100

## 2019-09-09 MED ORDER — ACETAMINOPHEN-CODEINE #3 300-30 MG PO TABS: 1.0000 | ORAL_TABLET | Freq: Four times a day (QID) | ORAL | 0 refills | Status: DC | PRN

## 2019-09-09 MED ORDER — HYDROMORPHONE HCL 1 MG/ML IJ SOLN
0.2500 mg | INTRAMUSCULAR | Status: DC | PRN
  Administered 2019-09-09: 0.5 mg via INTRAVENOUS
  Filled 2019-09-09: qty 0.5

## 2019-09-09 MED ORDER — MIDAZOLAM HCL 2 MG/2ML IJ SOLN
INTRAMUSCULAR | Status: AC
  Filled 2019-09-09: qty 2

## 2019-09-09 MED ORDER — PROPOFOL 500 MG/50ML IV EMUL
INTRAVENOUS | Status: DC | PRN
  Administered 2019-09-09: 75 ug/kg/min via INTRAVENOUS

## 2019-09-09 MED ORDER — ONDANSETRON HCL 4 MG/2ML IJ SOLN
4.0000 mg | Freq: Once | INTRAMUSCULAR | Status: AC
  Administered 2019-09-09: 4 mg via INTRAVENOUS
  Filled 2019-09-09: qty 2

## 2019-09-09 MED ORDER — LACTATED RINGERS IV SOLN
INTRAVENOUS | Status: DC
  Administered 2019-09-09: 08:00:00 via INTRAVENOUS

## 2019-09-09 MED ORDER — CEFAZOLIN SODIUM-DEXTROSE 2-4 GM/100ML-% IV SOLN
2.0000 g | INTRAVENOUS | Status: AC
  Administered 2019-09-09: 2 g via INTRAVENOUS

## 2019-09-09 MED ORDER — IBUPROFEN 800 MG PO TABS
800.0000 mg | ORAL_TABLET | Freq: Once | ORAL | Status: AC
  Administered 2019-09-09: 10:00:00 800 mg via ORAL
  Filled 2019-09-09: qty 1

## 2019-09-09 MED ORDER — MIDAZOLAM HCL 5 MG/5ML IJ SOLN
INTRAMUSCULAR | Status: DC | PRN
  Administered 2019-09-09: 1 mg via INTRAVENOUS

## 2019-09-09 MED ORDER — MIDAZOLAM HCL 2 MG/2ML IJ SOLN: 0.5000 mg | Freq: Once | INTRAMUSCULAR | Status: DC | PRN

## 2019-09-09 MED ORDER — HYDROCODONE-ACETAMINOPHEN 7.5-325 MG PO TABS: 1.0000 | ORAL_TABLET | Freq: Once | ORAL | Status: DC | PRN

## 2019-09-09 MED ORDER — PROMETHAZINE HCL 25 MG/ML IJ SOLN: 6.2500 mg | INTRAMUSCULAR | Status: DC | PRN

## 2019-09-09 MED ORDER — CHLORHEXIDINE GLUCONATE 4 % EX LIQD: 60.0000 mL | Freq: Once | CUTANEOUS | Status: DC

## 2019-09-09 MED ORDER — LIDOCAINE HCL (PF) 2 % IJ SOLN
INTRAMUSCULAR | Status: DC | PRN
  Administered 2019-09-09: 50 mg via INTRADERMAL

## 2019-09-09 MED ORDER — 0.9 % SODIUM CHLORIDE (POUR BTL) OPTIME
TOPICAL | Status: DC | PRN
  Administered 2019-09-09: 1000 mL

## 2019-09-09 SURGICAL SUPPLY — 38 items
BANDAGE ESMARK 4X12 BL STRL LF (DISPOSABLE) ×1 IMPLANT
BLADE SURG 15 STRL LF DISP TIS (BLADE) ×1 IMPLANT
BLADE SURG 15 STRL SS (BLADE) ×2
BNDG COHESIVE 4X5 TAN STRL (GAUZE/BANDAGES/DRESSINGS) ×3 IMPLANT
BNDG ELASTIC 3X5.8 VLCR NS LF (GAUZE/BANDAGES/DRESSINGS) ×3 IMPLANT
BNDG ESMARK 4X12 BLUE STRL LF (DISPOSABLE) ×3
BNDG GAUZE ELAST 4 BULKY (GAUZE/BANDAGES/DRESSINGS) ×3 IMPLANT
CHLORAPREP W/TINT 26 (MISCELLANEOUS) ×3 IMPLANT
CLOTH BEACON ORANGE TIMEOUT ST (SAFETY) ×3 IMPLANT
COVER LIGHT HANDLE STERIS (MISCELLANEOUS) ×6 IMPLANT
COVER WAND RF STERILE (DRAPES) ×3 IMPLANT
CUFF TOURN SGL QUICK 18X4 (TOURNIQUET CUFF) ×3 IMPLANT
DECANTER SPIKE VIAL GLASS SM (MISCELLANEOUS) ×3 IMPLANT
DRAPE HALF SHEET 40X57 (DRAPES) ×3 IMPLANT
DRSG XEROFORM 1X8 (GAUZE/BANDAGES/DRESSINGS) ×3 IMPLANT
ELECT NEEDLE TIP 2.8 STRL (NEEDLE) ×3 IMPLANT
ELECT REM PT RETURN 9FT ADLT (ELECTROSURGICAL) ×3
ELECTRODE REM PT RTRN 9FT ADLT (ELECTROSURGICAL) ×1 IMPLANT
GAUZE SPONGE 4X4 12PLY STRL (GAUZE/BANDAGES/DRESSINGS) ×3 IMPLANT
GLOVE BIOGEL PI IND STRL 7.0 (GLOVE) ×1 IMPLANT
GLOVE BIOGEL PI INDICATOR 7.0 (GLOVE) ×2
GLOVE ECLIPSE 6.5 STRL STRAW (GLOVE) ×3 IMPLANT
GLOVE SKINSENSE NS SZ8.0 LF (GLOVE) ×2
GLOVE SKINSENSE STRL SZ8.0 LF (GLOVE) ×1 IMPLANT
GLOVE SS N UNI LF 8.5 STRL (GLOVE) ×3 IMPLANT
GOWN STRL REUS W/ TWL LRG LVL3 (GOWN DISPOSABLE) ×1 IMPLANT
GOWN STRL REUS W/TWL LRG LVL3 (GOWN DISPOSABLE) ×2
GOWN STRL REUS W/TWL XL LVL3 (GOWN DISPOSABLE) ×3 IMPLANT
KIT TURNOVER KIT A (KITS) ×3 IMPLANT
MANIFOLD NEPTUNE II (INSTRUMENTS) ×3 IMPLANT
NEEDLE HYPO 21X1.5 SAFETY (NEEDLE) ×3 IMPLANT
NS IRRIG 1000ML POUR BTL (IV SOLUTION) ×3 IMPLANT
PACK BASIC LIMB (CUSTOM PROCEDURE TRAY) ×3 IMPLANT
PAD ARMBOARD 7.5X6 YLW CONV (MISCELLANEOUS) ×3 IMPLANT
POSITIONER HAND ALUMI XLG (MISCELLANEOUS) ×3 IMPLANT
SET BASIN LINEN APH (SET/KITS/TRAYS/PACK) ×3 IMPLANT
SUT ETHILON 3 0 FSL (SUTURE) ×3 IMPLANT
SYR CONTROL 10ML LL (SYRINGE) ×3 IMPLANT

## 2019-09-09 NOTE — Anesthesia Preprocedure Evaluation (Signed)
Anesthesia Evaluation  Patient identified by MRN, date of birth, ID band Patient awake    Reviewed: Allergy & Precautions, NPO status , Patient's Chart, lab work & pertinent test results  Airway Mallampati: III  TM Distance: >3 FB Neck ROM: Full    Dental no notable dental hx. (+) Teeth Intact   Pulmonary neg pulmonary ROS,    Pulmonary exam normal breath sounds clear to auscultation       Cardiovascular Exercise Tolerance: Good negative cardio ROS Normal cardiovascular examI Rhythm:Regular Rate:Normal     Neuro/Psych  Neuromuscular disease negative psych ROS   GI/Hepatic negative GI ROS, Neg liver ROS,   Endo/Other  negative endocrine ROSdiabetes, Type 2, Oral Hypoglycemic Agents  Renal/GU negative Renal ROS  negative genitourinary   Musculoskeletal negative musculoskeletal ROS (+)   Abdominal   Peds negative pediatric ROS (+)  Hematology negative hematology ROS (+)   Anesthesia Other Findings   Reproductive/Obstetrics negative OB ROS                             Anesthesia Physical Anesthesia Plan  ASA: II  Anesthesia Plan: Bier Block and MAC and Bier Block-LIDOCAINE ONLY   Post-op Pain Management:    Induction: Intravenous  PONV Risk Score and Plan: 1 and Midazolam, Ondansetron and Treatment may vary due to age or medical condition  Airway Management Planned: Nasal Cannula and Simple Face Mask  Additional Equipment:   Intra-op Plan:   Post-operative Plan:   Informed Consent: I have reviewed the patients History and Physical, chart, labs and discussed the procedure including the risks, benefits and alternatives for the proposed anesthesia with the patient or authorized representative who has indicated his/her understanding and acceptance.     Dental advisory given  Plan Discussed with: CRNA  Anesthesia Plan Comments: (Plan Full PPE use  Plan Bier block with MAC as  tolerated , with GA vs. GETA as needed d/w pt -WTP with same after Q&A)        Anesthesia Quick Evaluation

## 2019-09-09 NOTE — Brief Op Note (Signed)
09/09/2019  9:18 AM  PATIENT:  Jake Perkins  43 y.o. male  PRE-OPERATIVE DIAGNOSIS:  left carpal tunnel syndrome  POST-OPERATIVE DIAGNOSIS:  left carpal tunnel syndrome  PROCEDURE:  Procedure(s): LEFT CARPAL TUNNEL RELEASE (Left)  SURGEON:  Surgeon(s) and Role:    Carole Civil, MD - Primary  Carpal tunnel release left wrist  Preop diagnosis carpal tunnel syndrome left wrist   postop diagnosis same  Procedure open carpal tunnel release left wrist Surgeon Aline Brochure  Anesthesia regional Bier block  Operative findings compression of the left median nerve without any anterior carpal tunnel masses   Indications failure of conservative treatment to relieve pain and paresthesias and numbness and tingling of the left hand  The patient was identified in the preop area we confirm the surgical site marked as left wrist. Chart update completed. Patient taken to surgery. She had 2 g of Ancef. After establishing a Bier block left her arm was prepped with ChloraPrep.  Timeout executed completed and confirmed site.  A curved incision was made over the left carpal tunnel in line with the radial border of the ring finger. Blunt dissection was carried out to find the distal aspect of the carpal tunnel. A blunt instrument was passed beneath the carpal tunnel. Sharp incision was then used to release the transverse carpal ligament. The contents of the carpal tunnel were inspected. The median nerve was compressed without  discoloration.  The wound was irrigated and then closed with 3-0 nylon suture. We injected 10 mL of plain Marcaine on the radial side of the incision  A sterile bandage was applied and the tourniquet was released the color of the hand and capillary refill were normal  The patient was taken to the recovery room in stable condition  PHYSICIAN ASSISTANT:   ASSISTANTS: none   ANESTHESIA:   regional  EBL:  none   BLOOD ADMINISTERED:none  DRAINS: none   LOCAL  MEDICATIONS USED:  MARCAINE     SPECIMEN:  No Specimen  DISPOSITION OF SPECIMEN:  N/A  COUNTS:  YES  TOURNIQUET:  * Missing tourniquet times found for documented tourniquets in log: [id number] *  DICTATION: .Viviann Spare Dictation  PLAN OF CARE: Discharge to home after PACU  PATIENT DISPOSITION:  PACU - hemodynamically stable.   Delay start of Pharmacological VTE agent (>24hrs) due to surgical blood loss or risk of bleeding:no

## 2019-09-09 NOTE — Op Note (Signed)
09/09/2019  9:18 AM  PATIENT:  Jake Perkins  43 y.o. male  PRE-OPERATIVE DIAGNOSIS:  left carpal tunnel syndrome  POST-OPERATIVE DIAGNOSIS:  left carpal tunnel syndrome  PROCEDURE:  Procedure(s): LEFT CARPAL TUNNEL RELEASE (Left)  SURGEON:  Surgeon(s) and Role:    Carole Civil, MD - Primary  Carpal tunnel release left wrist  Preop diagnosis carpal tunnel syndrome left wrist   postop diagnosis same  Procedure open carpal tunnel release left wrist Surgeon Aline Brochure  Anesthesia regional Bier block  Operative findings compression of the left median nerve without any anterior carpal tunnel masses   Indications failure of conservative treatment to relieve pain and paresthesias and numbness and tingling of the left hand  The patient was identified in the preop area we confirm the surgical site marked as left wrist. Chart update completed. Patient taken to surgery. She had 2 g of Ancef. After establishing a Bier block left her arm was prepped with ChloraPrep.  Timeout executed completed and confirmed site.  A curved incision was made over the left carpal tunnel in line with the radial border of the ring finger. Blunt dissection was carried out to find the distal aspect of the carpal tunnel. A blunt instrument was passed beneath the carpal tunnel. Sharp incision was then used to release the transverse carpal ligament. The contents of the carpal tunnel were inspected. The median nerve was compressed without  discoloration.  The wound was irrigated and then closed with 3-0 nylon suture. We injected 10 mL of plain Marcaine on the radial side of the incision  A sterile bandage was applied and the tourniquet was released the color of the hand and capillary refill were normal  The patient was taken to the recovery room in stable condition  PHYSICIAN ASSISTANT:   ASSISTANTS: none   ANESTHESIA:   regional  EBL:  none   BLOOD ADMINISTERED:none  DRAINS: none   LOCAL  MEDICATIONS USED:  MARCAINE     SPECIMEN:  No Specimen  DISPOSITION OF SPECIMEN:  N/A  COUNTS:  YES  TOURNIQUET:  * Missing tourniquet times found for documented tourniquets in log: [id number] *  DICTATION: .Viviann Spare Dictation  PLAN OF CARE: Discharge to home after PACU  PATIENT DISPOSITION:  PACU - hemodynamically stable.   Delay start of Pharmacological VTE agent (>24hrs) due to surgical blood loss or risk of bleeding:no

## 2019-09-09 NOTE — Interval H&P Note (Signed)
History and Physical Interval Note:  09/09/2019 7:38 AM  Jake Perkins  has presented today for surgery, with the diagnosis of left carpal tunnel syndrome.  The various methods of treatment have been discussed with the patient and family. After consideration of risks, benefits and other options for treatment, the patient has consented to  Procedure(s): CARPAL TUNNEL RELEASE (Left) as a surgical intervention.  The patient's history has been reviewed, patient examined, no change in status, stable for surgery.  I have reviewed the patient's chart and labs.  Questions were answered to the patient's satisfaction.     Arther Abbott

## 2019-09-09 NOTE — Discharge Instructions (Signed)
Monitored Anesthesia Care, Care After °These instructions provide you with information about caring for yourself after your procedure. Your health care provider may also give you more specific instructions. Your treatment has been planned according to current medical practices, but problems sometimes occur. Call your health care provider if you have any problems or questions after your procedure. °What can I expect after the procedure? °After your procedure, you may: °· Feel sleepy for several hours. °· Feel clumsy and have poor balance for several hours. °· Feel forgetful about what happened after the procedure. °· Have poor judgment for several hours. °· Feel nauseous or vomit. °· Have a sore throat if you had a breathing tube during the procedure. °Follow these instructions at home: °For at least 24 hours after the procedure: ° °  ° °· Have a responsible adult stay with you. It is important to have someone help care for you until you are awake and alert. °· Rest as needed. °· Do not: °? Participate in activities in which you could fall or become injured. °? Drive. °? Use heavy machinery. °? Drink alcohol. °? Take sleeping pills or medicines that cause drowsiness. °? Make important decisions or sign legal documents. °? Take care of children on your own. °Eating and drinking °· Follow the diet that is recommended by your health care provider. °· If you vomit, drink water, juice, or soup when you can drink without vomiting. °· Make sure you have little or no nausea before eating solid foods. °General instructions °· Take over-the-counter and prescription medicines only as told by your health care provider. °· If you have sleep apnea, surgery and certain medicines can increase your risk for breathing problems. Follow instructions from your health care provider about wearing your sleep device: °? Anytime you are sleeping, including during daytime naps. °? While taking prescription pain medicines, sleeping medicines,  or medicines that make you drowsy. °· If you smoke, do not smoke without supervision. °· Keep all follow-up visits as told by your health care provider. This is important. °Contact a health care provider if: °· You keep feeling nauseous or you keep vomiting. °· You feel light-headed. °· You develop a rash. °· You have a fever. °Get help right away if: °· You have trouble breathing. °Summary °· For several hours after your procedure, you may feel sleepy and have poor judgment. °· Have a responsible adult stay with you for at least 24 hours or until you are awake and alert. °This information is not intended to replace advice given to you by your health care provider. Make sure you discuss any questions you have with your health care provider. °Document Released: 02/04/2016 Document Revised: 01/12/2018 Document Reviewed: 02/04/2016 °Elsevier Patient Education © 2020 [employer] ° °

## 2019-09-09 NOTE — Transfer of Care (Signed)
Immediate Anesthesia Transfer of Care Note  Patient: Jake Perkins  Procedure(s) Performed: LEFT CARPAL TUNNEL RELEASE (Left )  Patient Location: PACU  Anesthesia Type:MAC  Level of Consciousness: awake, alert  and oriented  Airway & Oxygen Therapy: Patient Spontanous Breathing  Post-op Assessment: Report given to RN, Post -op Vital signs reviewed and stable and Patient moving all extremities  Post vital signs: Reviewed and stable  Last Vitals:  Vitals Value Taken Time  BP    Temp    Pulse    Resp    SpO2      Last Pain:  Vitals:   09/09/19 0756  TempSrc: Oral  PainSc: 0-No pain      Patients Stated Pain Goal: 5 (82/06/01 5615)  Complications: No apparent anesthesia complications

## 2019-09-09 NOTE — Anesthesia Postprocedure Evaluation (Signed)
Anesthesia Post Note  Patient: Jake Perkins  Procedure(s) Performed: LEFT CARPAL TUNNEL RELEASE (Left )  Patient location during evaluation: PACU Anesthesia Type: MAC Level of consciousness: awake and alert Pain management: pain level controlled Vital Signs Assessment: post-procedure vital signs reviewed and stable Respiratory status: spontaneous breathing, nonlabored ventilation and respiratory function stable Cardiovascular status: stable Postop Assessment: no apparent nausea or vomiting and able to ambulate Anesthetic complications: no     Last Vitals:  Vitals:   09/09/19 0756  BP: 110/72  Pulse: 66  Resp: 18  Temp: 36.7 C    Last Pain:  Vitals:   09/09/19 0756  TempSrc: Oral  PainSc: 0-No pain                 Sakina Briones Hristova

## 2019-09-10 ENCOUNTER — Encounter (HOSPITAL_COMMUNITY): Payer: Self-pay | Admitting: Orthopedic Surgery

## 2019-09-15 ENCOUNTER — Telehealth: Payer: Self-pay | Admitting: Orthopedic Surgery

## 2019-09-15 ENCOUNTER — Other Ambulatory Visit: Payer: Self-pay | Admitting: Orthopedic Surgery

## 2019-09-15 NOTE — Telephone Encounter (Signed)
I called him to discuss, he voiced understanding.

## 2019-09-15 NOTE — Telephone Encounter (Signed)
2 weeks after surgery and or when he can make a tight fist

## 2019-09-15 NOTE — Telephone Encounter (Signed)
Patient called with question related to his recent carpal tunnel surgery, left, 09/09/19. Aware of his post operative appointment 09/22/19. Asking, due to planned surgery on his right(dominant hand) on 09/30/19, how long his left hand/wrist may be ready for safely driving, as his job involves extensive driving.

## 2019-09-21 NOTE — Patient Instructions (Signed)
[facility]  09/21/2019     @PREFPERIOPPHARMACY @   Your procedure is scheduled on  09/30/2019 .  [name]  Call this number if you have problems the morning of surgery:  [phone]   Remember:  Do not eat or drink after midnight.                       Take these medicines the morning of surgery with A SIP OF WATER  Tylenol #3(if needed).    Do not wear jewelry, make-up or nail polish.  Do not wear lotions, powders, or perfumes, or deodorant. Please brush your teeth..  Do not shave 48 hours prior to surgery.  Men may shave face and neck.  Do not bring valuables to the [facility].  [facility] is not responsible for any belongings or valuables.  Contacts, dentures or bridgework may not be worn into surgery.  Leave your suitcase in the car.  After surgery it may be brought to your room.  For patients admitted to the [facility], discharge time will be determined by your treatment team.  Patients discharged the day of surgery will not be allowed to drive home.   Name and phone number of your driver:   family Special instructions:  None  Please read over the following fact sheets that you were given. Anesthesia Post-op Instructions and Care and Recovery After Surgery       Monitored Anesthesia Care, Care After These instructions provide you with information about caring for yourself after your procedure. Your health care provider may also give you more specific instructions. Your treatment has been planned according to current medical practices, but problems sometimes occur. Call your health care provider if you have any problems or questions after your procedure. What can I expect after the procedure? After your procedure, you may:  Feel sleepy for several hours.  Feel clumsy and have poor balance for several hours.  Feel forgetful about what happened after the procedure.  Have poor judgment for several hours.  Feel nauseous or  vomit.  Have a sore throat if you had a breathing tube during the procedure. Follow these instructions at home: For at least 24 hours after the procedure:      Have a responsible adult stay with you. It is important to have someone help care for you until you are awake and alert.  Rest as needed.  Do not: ? Participate in activities in which you could fall or become injured. ? Drive. ? Use heavy machinery. ? Drink alcohol. ? Take sleeping pills or medicines that cause drowsiness. ? Make important decisions or sign legal documents. ? Take care of children on your own. Eating and drinking  Follow the diet that is recommended by your health care provider.  If you vomit, drink water, juice, or soup when you can drink without vomiting.  Make sure you have little or no nausea before eating solid foods. General instructions  Take over-the-counter and prescription medicines only as told by your health care provider.  If you have sleep apnea, surgery and certain medicines can increase your risk for breathing problems. Follow instructions from your health care provider about wearing your sleep device: ? Anytime you are sleeping, including during daytime naps. ? While taking prescription pain medicines, sleeping medicines, or medicines that make you drowsy.  If you smoke, do not smoke without supervision.  Keep  all follow-up visits as told by your health care provider. This is important. Contact a health care provider if:  You keep feeling nauseous or you keep vomiting.  You feel light-headed.  You develop a rash.  You have a fever. Get help right away if:  You have trouble breathing. Summary  For several hours after your procedure, you may feel sleepy and have poor judgment.  Have a responsible adult stay with you for at least 24 hours or until you are awake and alert. This information is not intended to replace advice given to you by your health care provider. Make sure  you discuss any questions you have with your health care provider. Document Released: 02/04/2016 Document Revised: 01/12/2018 Document Reviewed: 02/04/2016 Elsevier Patient Education  [place] After This sheet gives you information about how to care for yourself after your procedure. Your health care provider may also give you more specific instructions. If you have problems or questions, contact your health care provider. What can I expect after the procedure? After the procedure, it is common to have:  Wrist stiffness.  Bruising. Follow these instructions at home: Bathing  Do not take baths, swim, or use a hot tub until your health care provider approves. Ask your health care provider if you may take showers.  Keep your bandage (dressing) dry until your health care provider says it can be removed. If you have a splint or brace:  Wear the splint or brace as told by your health care provider. You may need to wear it for 2-3 weeks. Remove it only as told by your health care provider.  Loosen the splint or brace if your fingers tingle, become numb, or turn cold and blue.  Keep the splint or brace clean.  If the splint or brace is not waterproof: ? Do not let it get wet. ? Cover it with a watertight covering when you take a bath or a shower. Incision care   Follow instructions from your health care provider about how to take care of your incision. Make sure you: ? Wash your hands with soap and water before you change your dressing. If soap and water are not available, use hand sanitizer. ? Change your dressing as told by your health care provider. ? Leave stitches (sutures), skin glue, or adhesive strips in place. These skin closures may need to stay in place for 2 weeks or longer. If adhesive strip edges start to loosen and curl up, you may trim the loose edges. Do not remove adhesive strips completely unless your health care provider  tells you to do that.  Check your incision area every day for signs of infection. Check for: ? Redness, swelling, or pain. ? Fluid or blood. ? Warmth. ? Pus or a bad smell. Managing pain, stiffness, and swelling   If directed, put ice on the affected area. ? If you have a removable splint or brace, remove it as told by your health care provider. ? Put ice in a plastic bag. ? Place a towel between your skin and the bag. ? Leave the ice on for 20 minutes, 2-3 times a day.  Move your fingers often to avoid stiffness and to lessen swelling.  Raise (elevate) your wrist above the level of your heart while you are sitting or lying down. Activity  Do not drive until your health care provider approves.  Do not drive or use heavy machinery while taking prescription pain medicine.  Return to your normal activities as told by your health care provider. Avoid activities that cause pain.  If physical therapy was prescribed, do exercises as told by your therapist. Physical therapy can help you heal faster and regain movement. General instructions  Take over-the-counter and prescription medicines only as told by your health care provider.  If you are taking prescription pain medicine, take actions to prevent or treat constipation. Your health care provider may recommend that you: ? Drink enough fluid to keep your urine pale yellow. ? Eat foods that are high in fiber, such as fresh fruits and vegetables, whole grains, and beans. ? Limit foods that are high in fat and processed sugars, such as fried or sweet foods. ? Take an over-the-counter or prescription medicine for constipation.  Do not use any products that contain nicotine or tobacco, such as cigarettes and e-cigarettes. If you need help quitting, ask your health care provider.  Keep all follow-up visits as told by your health care provider and physical therapist. This is important. Contact a health care provider if:  You have  redness or swelling around your incision.  You have fluid or blood coming from your incision.  Your incision feels warm to the touch.  You have pus or a bad smell coming from your incision.  You have a fever.  You have chills.  You have pain that does not get better with medicine.  Your carpal tunnel symptoms do not go away after 2 months.  Your carpal tunnel symptoms go away and then come back. Get help right away if:  You have pain or numbness that is getting worse.  Your fingers or fingertips become very pale or bluish in color.  You are not able to move your fingers. Summary  It is common to have wrist stiffness and bruising after a carpal tunnel release.  Icing and raising (elevating) your wrist may help to lessen swelling and pain.  Call your health care provider if you have a fever or notice any signs of infection in your incision area. This information is not intended to replace advice given to you by your health care provider. Make sure you discuss any questions you have with your health care provider. Document Released: 05/03/2005 Document Revised: 09/26/2017 Document Reviewed: 06/23/2017 Elsevier Patient Education  2020 [employer]. How to Use Chlorhexidine for Bathing Chlorhexidine gluconate (CHG) is a germ-killing (antiseptic) solution that is used to clean the skin. It can get rid of the bacteria that normally live on the skin and can keep them away for about 24 hours. To clean your skin with CHG, you may be given:  A CHG solution to use in the shower or as part of a sponge bath.  A prepackaged cloth that contains CHG. Cleaning your skin with CHG may help lower the risk for infection:  While you are staying in the intensive care unit of the [facility].  If you have a vascular access, such as a central line, to provide short-term or long-term access to your veins.  If you have a catheter to drain urine from your bladder.  If you are on a ventilator. A  ventilator is a machine that helps you breathe by moving air in and out of your lungs.  After surgery. What are the risks? Risks of using CHG include:  A skin reaction.  Hearing loss, if CHG gets in your ears.  Eye injury, if CHG gets in your eyes and is not rinsed out.  The CHG  product catching fire. Make sure that you avoid smoking and flames after applying CHG to your skin. Do not use CHG:  If you have a chlorhexidine allergy or have previously reacted to chlorhexidine.  On babies younger than 79 months of age. How to use CHG solution  Use CHG only as told by your health care provider, and follow the instructions on the label.  Use the full amount of CHG as directed. Usually, this is one bottle. During a shower Follow these steps when using CHG solution during a shower (unless your health care provider gives you different instructions): 1. Start the shower. 2. Use your normal soap and shampoo to wash your face and hair. 3. Turn off the shower or move out of the shower stream. 4. Pour the CHG onto a clean washcloth. Do not use any type of brush or rough-edged sponge. 5. Starting at your neck, lather your body down to your toes. Make sure you follow these instructions: ? If you will be having surgery, pay special attention to the part of your body where you will be having surgery. Scrub this area for at least 1 minute. ? Do not use CHG on your head or face. If the solution gets into your ears or eyes, rinse them well with water. ? Avoid your genital area. ? Avoid any areas of skin that have broken skin, cuts, or scrapes. ? Scrub your back and under your arms. Make sure to wash skin folds. 6. Let the lather sit on your skin for 1-2 minutes or as long as told by your health care provider. 7. Thoroughly rinse your entire body in the shower. Make sure that all body creases and crevices are rinsed well. 8. Dry off with a clean towel. Do not put any substances on your body  afterward-such as powder, lotion, or perfume-unless you are told to do so by your health care provider. Only use lotions that are recommended by the manufacturer. 9. Put on clean clothes or pajamas. 10. If it is the night before your surgery, sleep in clean sheets.  During a sponge bath Follow these steps when using CHG solution during a sponge bath (unless your health care provider gives you different instructions): 1. Use your normal soap and shampoo to wash your face and hair. 2. Pour the CHG onto a clean washcloth. 3. Starting at your neck, lather your body down to your toes. Make sure you follow these instructions: ? If you will be having surgery, pay special attention to the part of your body where you will be having surgery. Scrub this area for at least 1 minute. ? Do not use CHG on your head or face. If the solution gets into your ears or eyes, rinse them well with water. ? Avoid your genital area. ? Avoid any areas of skin that have broken skin, cuts, or scrapes. ? Scrub your back and under your arms. Make sure to wash skin folds. 4. Let the lather sit on your skin for 1-2 minutes or as long as told by your health care provider. 5. Using a different clean, wet washcloth, thoroughly rinse your entire body. Make sure that all body creases and crevices are rinsed well. 6. Dry off with a clean towel. Do not put any substances on your body afterward-such as powder, lotion, or perfume-unless you are told to do so by your health care provider. Only use lotions that are recommended by the manufacturer. 7. Put on clean clothes or pajamas. 8. If  it is the night before your surgery, sleep in clean sheets. How to use CHG prepackaged cloths  Only use CHG cloths as told by your health care provider, and follow the instructions on the label.  Use the CHG cloth on clean, dry skin.  Do not use the CHG cloth on your head or face unless your health care provider tells you to.  When washing with the  CHG cloth: ? Avoid your genital area. ? Avoid any areas of skin that have broken skin, cuts, or scrapes. Before surgery Follow these steps when using a CHG cloth to clean before surgery (unless your health care provider gives you different instructions): 1. Using the CHG cloth, vigorously scrub the part of your body where you will be having surgery. Scrub using a back-and-forth motion for 3 minutes. The area on your body should be completely wet with CHG when you are done scrubbing. 2. Do not rinse. Discard the cloth and let the area air-dry. Do not put any substances on the area afterward, such as powder, lotion, or perfume. 3. Put on clean clothes or pajamas. 4. If it is the night before your surgery, sleep in clean sheets.  For general bathing Follow these steps when using CHG cloths for general bathing (unless your health care provider gives you different instructions). 1. Use a separate CHG cloth for each area of your body. Make sure you wash between any folds of skin and between your fingers and toes. Wash your body in the following order, switching to a new cloth after each step: ? The front of your neck, shoulders, and chest. ? Both of your arms, under your arms, and your hands. ? Your stomach and groin area, avoiding the genitals. ? Your right leg and foot. ? Your left leg and foot. ? The back of your neck, your back, and your buttocks. 2. Do not rinse. Discard the cloth and let the area air-dry. Do not put any substances on your body afterward-such as powder, lotion, or perfume-unless you are told to do so by your health care provider. Only use lotions that are recommended by the manufacturer. 3. Put on clean clothes or pajamas. Contact a health care provider if:  Your skin gets irritated after scrubbing.  You have questions about using your solution or cloth. Get help right away if:  Your eyes become very red or swollen.  Your eyes itch badly.  Your skin itches badly and is  red or swollen.  Your hearing changes.  You have trouble seeing.  You have swelling or tingling in your mouth or throat.  You have trouble breathing.  You swallow any chlorhexidine. Summary  Chlorhexidine gluconate (CHG) is a germ-killing (antiseptic) solution that is used to clean the skin. Cleaning your skin with CHG may help to lower your risk for infection.  You may be given CHG to use for bathing. It may be in a bottle or in a prepackaged cloth to use on your skin. Carefully follow your health care provider's instructions and the instructions on the product label.  Do not use CHG if you have a chlorhexidine allergy.  Contact your health care provider if your skin gets irritated after scrubbing. This information is not intended to replace advice given to you by your health care provider. Make sure you discuss any questions you have with your health care provider. Document Released: 07/08/2012 Document Revised: 12/31/2018 Document Reviewed: 09/11/2017 Elsevier Patient Education  2020 [employer].

## 2019-09-22 ENCOUNTER — Other Ambulatory Visit: Payer: Self-pay

## 2019-09-22 ENCOUNTER — Ambulatory Visit (INDEPENDENT_AMBULATORY_CARE_PROVIDER_SITE_OTHER): Payer: Managed Care, Other (non HMO) | Admitting: Orthopedic Surgery

## 2019-09-22 VITALS — BP 131/91 | HR 78 | Temp 97.1°F | Ht 66.0 in | Wt 168.0 lb

## 2019-09-22 DIAGNOSIS — Z9889 Other specified postprocedural states: Secondary | ICD-10-CM | POA: Insufficient documentation

## 2019-09-22 NOTE — Progress Notes (Signed)
Chief Complaint  Patient presents with  . Follow-up    Recheck on left CTR, DOS 09-09-19.    Postop day 13 from a left carpal tunnel release patient says his fingers feel good except for the tips are a little tingly  I did not feel the sutures were ready to come out and we will bring him back on Monday to get the stitches out  Surgery pending Thursday for the other carpal tunnel release on the right

## 2019-09-27 ENCOUNTER — Encounter (HOSPITAL_COMMUNITY)
Admission: RE | Admit: 2019-09-27 | Discharge: 2019-09-27 | Disposition: A | Discharge status: Home / Self Care | Payer: Managed Care, Other (non HMO) | Source: Ambulatory Visit | Attending: Orthopedic Surgery | Admitting: Orthopedic Surgery

## 2019-09-27 ENCOUNTER — Ambulatory Visit (INDEPENDENT_AMBULATORY_CARE_PROVIDER_SITE_OTHER): Payer: Managed Care, Other (non HMO) | Admitting: Orthopedic Surgery

## 2019-09-27 ENCOUNTER — Other Ambulatory Visit: Payer: Self-pay

## 2019-09-27 ENCOUNTER — Encounter (HOSPITAL_COMMUNITY): Payer: Self-pay

## 2019-09-27 ENCOUNTER — Encounter: Payer: Self-pay | Admitting: Orthopedic Surgery

## 2019-09-27 DIAGNOSIS — Z01818 Encounter for other preprocedural examination: Secondary | ICD-10-CM | POA: Diagnosis present

## 2019-09-27 DIAGNOSIS — Z9889 Other specified postprocedural states: Secondary | ICD-10-CM

## 2019-09-27 NOTE — Patient Instructions (Signed)
Surgery Thursday  Follow up to be arranged

## 2019-09-27 NOTE — Progress Notes (Signed)
Chief Complaint  Patient presents with  . Routine Post Op    09/09/2019 left CTR     Left carpal tunnel release postop day 18 suture removal  We applied some skin glue to the wound  The patient will be seen Thursday for surgery and then follow-up in 2 weeks

## 2019-09-28 ENCOUNTER — Encounter: Payer: Self-pay | Admitting: Orthopedic Surgery

## 2019-09-28 ENCOUNTER — Ambulatory Visit (INDEPENDENT_AMBULATORY_CARE_PROVIDER_SITE_OTHER): Payer: Managed Care, Other (non HMO) | Admitting: Orthopedic Surgery

## 2019-09-28 ENCOUNTER — Telehealth: Payer: Self-pay | Admitting: Orthopedic Surgery

## 2019-09-28 DIAGNOSIS — Z9889 Other specified postprocedural states: Secondary | ICD-10-CM

## 2019-09-28 DIAGNOSIS — T8131XA Disruption of external operation (surgical) wound, not elsewhere classified, initial encounter: Secondary | ICD-10-CM

## 2019-09-28 DIAGNOSIS — G5602 Carpal tunnel syndrome, left upper limb: Secondary | ICD-10-CM

## 2019-09-28 NOTE — Progress Notes (Signed)
Chief Complaint  Patient presents with  . Wound Check    middle of incison left hand has opened some 09/09/2019 date of surgery     Wound problem.  Hard of the incision opened up.  There are no signs of infection  Recommend Neosporin Band-Aid okay to keep it dry okay to shower  Keep follow-up.

## 2019-09-28 NOTE — Telephone Encounter (Signed)
Dr Aline Brochure will see him

## 2019-09-28 NOTE — Telephone Encounter (Signed)
Patient is on way; aware.

## 2019-09-28 NOTE — Telephone Encounter (Signed)
Patient here

## 2019-09-28 NOTE — Telephone Encounter (Signed)
Patient called to relay that he has had his wound come open; routing to clinical staff, as he is about 20 minutes away.

## 2019-09-28 NOTE — Patient Instructions (Signed)
Apply Neosporin to the wound  Okay to shower  After shower pat it dry apply the Neosporin and then apply the Band-Aid  Follow-up as scheduled

## 2019-09-29 ENCOUNTER — Other Ambulatory Visit (HOSPITAL_COMMUNITY): Admission: RE | Admit: 2019-09-29 | Payer: Managed Care, Other (non HMO) | Source: Ambulatory Visit

## 2019-09-29 ENCOUNTER — Other Ambulatory Visit: Payer: Self-pay

## 2019-09-29 ENCOUNTER — Other Ambulatory Visit (HOSPITAL_COMMUNITY)
Admission: RE | Admit: 2019-09-29 | Discharge: 2019-09-29 | Disposition: A | Discharge status: Home / Self Care | Payer: Managed Care, Other (non HMO) | Source: Ambulatory Visit | Attending: Orthopedic Surgery | Admitting: Orthopedic Surgery

## 2019-09-29 DIAGNOSIS — Z01812 Encounter for preprocedural laboratory examination: Secondary | ICD-10-CM | POA: Insufficient documentation

## 2019-09-29 DIAGNOSIS — Z20828 Contact with and (suspected) exposure to other viral communicable diseases: Secondary | ICD-10-CM | POA: Insufficient documentation

## 2019-09-29 LAB — SARS CORONAVIRUS 2 (TAT 6-24 HRS): SARS Coronavirus 2: NEGATIVE

## 2019-09-29 NOTE — H&P (Signed)
[facility] is an 43 y.o. male.   Chief Complaint: Right hand wrist numbness and tingling HPI: 43 year old male had a recent left carpal tunnel release.  He complains of pain and paresthesias right upper extremity presents for right carpal tunnel release  Past Medical History:  Diagnosis Date  . Carpal tunnel syndrome, bilateral   . Diabetes mellitus     Past Surgical History:  Procedure Laterality Date  . CARPAL TUNNEL RELEASE Left 09/09/2019   Procedure: LEFT CARPAL TUNNEL RELEASE;  Surgeon: Carole Civil, MD;  Location: [facility];  Service: Orthopedics;  Laterality: Left;    Family History  Problem Relation Age of Onset  . Diabetes Mother   . Diabetes Father    Social History:  reports that he has never smoked. He has never used smokeless tobacco. He reports that he does not drink alcohol or use drugs.  Allergies:  Allergies  Allergen Reactions  . Jentadueto [Linagliptin-Metformin Hcl Er] Hives and Other (See Comments)    No medications prior to admission.    No results found for this or any previous visit (from the past 48 hour(s)). No results found.  Review of Systems  All other systems reviewed and are negative.   There were no vitals taken for this visit. Physical Exam  Constitutional: He is oriented to person, place, and time. He appears well-developed and well-nourished.  HENT:  Head: Normocephalic and atraumatic.  Eyes: Pupils are equal, round, and reactive to light. Conjunctivae and EOM are normal.  Neck: Normal range of motion. Neck supple.  Cardiovascular: Normal rate and intact distal pulses.  Respiratory: Effort normal and breath sounds normal.  Musculoskeletal:     Comments: Right hand and wrist tenderness over the carpal tunnel positive carpal tunnel test of compression and Phalen's test decreased grip strength on the right.  Neurological: He is alert and oriented to person, place, and time. He has normal reflexes.  Skin: Skin is warm and dry.   Psychiatric: He has a normal mood and affect. His behavior is normal. Judgment and thought content normal.     Assessment/Plan Right carpal tunnel syndrome  Open right carpal tunnel release  Arther Abbott, MD 09/29/2019, 3:07 PM

## 2019-09-30 ENCOUNTER — Ambulatory Visit (HOSPITAL_COMMUNITY): Payer: Managed Care, Other (non HMO) | Admitting: Anesthesiology

## 2019-09-30 ENCOUNTER — Encounter (HOSPITAL_COMMUNITY)
Admission: RE | Disposition: A | Discharge status: Home / Self Care | Payer: Self-pay | Source: Home / Self Care | Attending: Orthopedic Surgery

## 2019-09-30 ENCOUNTER — Ambulatory Visit (HOSPITAL_COMMUNITY)
Admission: RE | Admit: 2019-09-30 | Discharge: 2019-09-30 | Disposition: A | Discharge status: Home / Self Care | Payer: Managed Care, Other (non HMO) | Attending: Orthopedic Surgery | Admitting: Orthopedic Surgery

## 2019-09-30 ENCOUNTER — Encounter (HOSPITAL_COMMUNITY): Payer: Self-pay | Admitting: *Deleted

## 2019-09-30 DIAGNOSIS — Z833 Family history of diabetes mellitus: Secondary | ICD-10-CM | POA: Diagnosis not present

## 2019-09-30 DIAGNOSIS — E119 Type 2 diabetes mellitus without complications: Secondary | ICD-10-CM | POA: Insufficient documentation

## 2019-09-30 DIAGNOSIS — Z7984 Long term (current) use of oral hypoglycemic drugs: Secondary | ICD-10-CM | POA: Diagnosis not present

## 2019-09-30 DIAGNOSIS — Z888 Allergy status to other drugs, medicaments and biological substances status: Secondary | ICD-10-CM | POA: Insufficient documentation

## 2019-09-30 DIAGNOSIS — G5601 Carpal tunnel syndrome, right upper limb: Secondary | ICD-10-CM | POA: Diagnosis present

## 2019-09-30 HISTORY — PX: CARPAL TUNNEL RELEASE: SHX101

## 2019-09-30 LAB — GLUCOSE, CAPILLARY
Glucose-Capillary: 164 mg/dL — ABNORMAL HIGH (ref 70–99)
Glucose-Capillary: 188 mg/dL — ABNORMAL HIGH (ref 70–99)

## 2019-09-30 SURGERY — CARPAL TUNNEL RELEASE
Anesthesia: Monitor Anesthesia Care | Laterality: Right

## 2019-09-30 MED ORDER — FENTANYL CITRATE (PF) 100 MCG/2ML IJ SOLN
INTRAMUSCULAR | Status: AC
  Filled 2019-09-30: qty 2

## 2019-09-30 MED ORDER — FENTANYL CITRATE (PF) 100 MCG/2ML IJ SOLN
INTRAMUSCULAR | Status: DC | PRN
  Administered 2019-09-30: 50 ug via INTRAVENOUS

## 2019-09-30 MED ORDER — LIDOCAINE HCL (PF) 0.5 % IJ SOLN
INTRAMUSCULAR | Status: DC | PRN
  Administered 2019-09-30: 50 mL via INTRAVENOUS

## 2019-09-30 MED ORDER — 0.9 % SODIUM CHLORIDE (POUR BTL) OPTIME
TOPICAL | Status: DC | PRN
  Administered 2019-09-30: 1000 mL

## 2019-09-30 MED ORDER — MIDAZOLAM HCL 2 MG/2ML IJ SOLN
INTRAMUSCULAR | Status: AC
  Filled 2019-09-30: qty 2

## 2019-09-30 MED ORDER — HYDROMORPHONE HCL 1 MG/ML IJ SOLN
0.2500 mg | INTRAMUSCULAR | Status: DC | PRN
  Administered 2019-09-30: 0.5 mg via INTRAVENOUS
  Filled 2019-09-30: qty 0.5

## 2019-09-30 MED ORDER — CHLORHEXIDINE GLUCONATE 4 % EX LIQD: 60.0000 mL | Freq: Once | CUTANEOUS | Status: DC

## 2019-09-30 MED ORDER — HYDROCODONE-ACETAMINOPHEN 7.5-325 MG PO TABS
1.0000 | ORAL_TABLET | Freq: Once | ORAL | Status: AC | PRN
  Administered 2019-09-30: 1 via ORAL
  Filled 2019-09-30: qty 1

## 2019-09-30 MED ORDER — ONDANSETRON HCL 4 MG/2ML IJ SOLN
INTRAMUSCULAR | Status: DC | PRN
  Administered 2019-09-30: 4 mg via INTRAVENOUS

## 2019-09-30 MED ORDER — PROPOFOL 500 MG/50ML IV EMUL
INTRAVENOUS | Status: DC | PRN
  Administered 2019-09-30: 50 ug/kg/min via INTRAVENOUS

## 2019-09-30 MED ORDER — MIDAZOLAM HCL 2 MG/2ML IJ SOLN: 0.5000 mg | Freq: Once | INTRAMUSCULAR | Status: DC | PRN

## 2019-09-30 MED ORDER — BUPIVACAINE HCL (PF) 0.5 % IJ SOLN
INTRAMUSCULAR | Status: AC
  Filled 2019-09-30: qty 30

## 2019-09-30 MED ORDER — MIDAZOLAM HCL 2 MG/2ML IJ SOLN
INTRAMUSCULAR | Status: DC | PRN
  Administered 2019-09-30 (×2): 1 mg via INTRAVENOUS

## 2019-09-30 MED ORDER — HYDROCODONE-ACETAMINOPHEN 5-325 MG PO TABS: 1.0000 | ORAL_TABLET | Freq: Once | ORAL | Status: DC

## 2019-09-30 MED ORDER — LIDOCAINE 2% (20 MG/ML) 5 ML SYRINGE
INTRAMUSCULAR | Status: AC
  Filled 2019-09-30: qty 5

## 2019-09-30 MED ORDER — IBUPROFEN 800 MG PO TABS
800.0000 mg | ORAL_TABLET | Freq: Once | ORAL | Status: AC
  Administered 2019-09-30: 800 mg via ORAL

## 2019-09-30 MED ORDER — BUPIVACAINE HCL (PF) 0.5 % IJ SOLN
INTRAMUSCULAR | Status: DC | PRN
  Administered 2019-09-30: 10 mL

## 2019-09-30 MED ORDER — LACTATED RINGERS IV SOLN
INTRAVENOUS | Status: DC
  Administered 2019-09-30: 1000 mL via INTRAVENOUS

## 2019-09-30 MED ORDER — PROMETHAZINE HCL 25 MG/ML IJ SOLN: 6.2500 mg | INTRAMUSCULAR | Status: DC | PRN

## 2019-09-30 MED ORDER — LIDOCAINE HCL (PF) 0.5 % IJ SOLN
INTRAMUSCULAR | Status: AC
  Filled 2019-09-30: qty 50

## 2019-09-30 MED ORDER — IBUPROFEN 800 MG PO TABS
ORAL_TABLET | ORAL | Status: AC
  Filled 2019-09-30: qty 1

## 2019-09-30 MED ORDER — CEFAZOLIN SODIUM-DEXTROSE 2-4 GM/100ML-% IV SOLN
2.0000 g | INTRAVENOUS | Status: AC
  Administered 2019-09-30: 2 g via INTRAVENOUS
  Filled 2019-09-30: qty 100

## 2019-09-30 MED ORDER — ACETAMINOPHEN-CODEINE #3 300-30 MG PO TABS: 1.0000 | ORAL_TABLET | Freq: Four times a day (QID) | ORAL | 0 refills | Status: AC | PRN

## 2019-09-30 MED ORDER — PROPOFOL 10 MG/ML IV BOLUS
INTRAVENOUS | Status: AC
  Filled 2019-09-30: qty 20

## 2019-09-30 SURGICAL SUPPLY — 40 items
BANDAGE ESMARK 4X12 BL STRL LF (DISPOSABLE) ×1 IMPLANT
BLADE SURG 15 STRL LF DISP TIS (BLADE) ×1 IMPLANT
BLADE SURG 15 STRL SS (BLADE) ×2
BNDG COHESIVE 4X5 TAN STRL (GAUZE/BANDAGES/DRESSINGS) ×3 IMPLANT
BNDG ELASTIC 3X5.8 VLCR NS LF (GAUZE/BANDAGES/DRESSINGS) ×3 IMPLANT
BNDG ESMARK 4X12 BLUE STRL LF (DISPOSABLE) ×3
BNDG GAUZE ELAST 4 BULKY (GAUZE/BANDAGES/DRESSINGS) ×3 IMPLANT
CHLORAPREP W/TINT 26 (MISCELLANEOUS) ×3 IMPLANT
CLOTH BEACON ORANGE TIMEOUT ST (SAFETY) ×3 IMPLANT
COVER LIGHT HANDLE STERIS (MISCELLANEOUS) ×6 IMPLANT
COVER WAND RF STERILE (DRAPES) ×3 IMPLANT
CUFF TOURN SGL QUICK 18X4 (TOURNIQUET CUFF) ×3 IMPLANT
DECANTER SPIKE VIAL GLASS SM (MISCELLANEOUS) ×3 IMPLANT
DRAPE HALF SHEET 40X57 (DRAPES) ×3 IMPLANT
DRSG XEROFORM 1X8 (GAUZE/BANDAGES/DRESSINGS) ×3 IMPLANT
ELECT NEEDLE TIP 2.8 STRL (NEEDLE) ×3 IMPLANT
ELECT REM PT RETURN 9FT ADLT (ELECTROSURGICAL) ×3
ELECTRODE REM PT RTRN 9FT ADLT (ELECTROSURGICAL) ×1 IMPLANT
GAUZE SPONGE 4X4 12PLY STRL (GAUZE/BANDAGES/DRESSINGS) ×3 IMPLANT
GLOVE BIOGEL PI IND STRL 7.0 (GLOVE) ×1 IMPLANT
GLOVE BIOGEL PI INDICATOR 7.0 (GLOVE) ×2
GLOVE ECLIPSE 6.5 STRL STRAW (GLOVE) ×3 IMPLANT
GLOVE SKINSENSE NS SZ8.0 LF (GLOVE) ×2
GLOVE SKINSENSE STRL SZ8.0 LF (GLOVE) ×1 IMPLANT
GLOVE SS N UNI LF 8.5 STRL (GLOVE) ×3 IMPLANT
GOWN STRL REUS W/ TWL LRG LVL3 (GOWN DISPOSABLE) ×1 IMPLANT
GOWN STRL REUS W/TWL LRG LVL3 (GOWN DISPOSABLE) ×2
GOWN STRL REUS W/TWL XL LVL3 (GOWN DISPOSABLE) ×3 IMPLANT
KIT TURNOVER KIT A (KITS) ×3 IMPLANT
MANIFOLD NEPTUNE II (INSTRUMENTS) ×3 IMPLANT
NEEDLE HYPO 21X1.5 SAFETY (NEEDLE) ×3 IMPLANT
NS IRRIG 1000ML POUR BTL (IV SOLUTION) ×3 IMPLANT
PACK BASIC LIMB (CUSTOM PROCEDURE TRAY) ×3 IMPLANT
PAD ARMBOARD 7.5X6 YLW CONV (MISCELLANEOUS) ×3 IMPLANT
PADDING WEBRIL 4 STERILE (GAUZE/BANDAGES/DRESSINGS) ×3 IMPLANT
POSITIONER HAND ALUMI XLG (MISCELLANEOUS) ×3 IMPLANT
SET BASIN LINEN APH (SET/KITS/TRAYS/PACK) ×3 IMPLANT
SUT ETHILON 3 0 FSL (SUTURE) ×3 IMPLANT
SYR 50ML LL SCALE MARK (SYRINGE) ×3 IMPLANT
SYR CONTROL 10ML LL (SYRINGE) ×3 IMPLANT

## 2019-09-30 NOTE — Discharge Instructions (Signed)
°  Monitored Anesthesia Care, Care After These instructions provide you with information about caring for yourself after your procedure. Your health care provider may also give you more specific instructions. Your treatment has been planned according to current medical practices, but problems sometimes occur. Call your health care provider if you have any problems or questions after your procedure. What can I expect after the procedure? After your procedure, you may:  Feel sleepy for several hours.  Feel clumsy and have poor balance for several hours.  Feel forgetful about what happened after the procedure.  Have poor judgment for several hours.  Feel nauseous or vomit.  Have a sore throat if you had a breathing tube during the procedure. Follow these instructions at home: For at least 24 hours after the procedure:      Have a responsible adult stay with you. It is important to have someone help care for you until you are awake and alert.  Rest as needed.  Do not: ? Participate in activities in which you could fall or become injured. ? Drive. ? Use heavy machinery. ? Drink alcohol. ? Take sleeping pills or medicines that cause drowsiness. ? Make important decisions or sign legal documents. ? Take care of children on your own. Eating and drinking  Follow the diet that is recommended by your health care provider.  If you vomit, drink water, juice, or soup when you can drink without vomiting.  Make sure you have little or no nausea before eating solid foods. General instructions  Take over-the-counter and prescription medicines only as told by your health care provider.  If you have sleep apnea, surgery and certain medicines can increase your risk for breathing problems. Follow instructions from your health care provider about wearing your sleep device: ? Anytime you are sleeping, including during daytime naps. ? While taking prescription pain medicines, sleeping medicines,  or medicines that make you drowsy.  If you smoke, do not smoke without supervision.  Keep all follow-up visits as told by your health care provider. This is important. Contact a health care provider if:  You keep feeling nauseous or you keep vomiting.  You feel light-headed.  You develop a rash.  You have a fever. Get help right away if:  You have trouble breathing. Summary  For several hours after your procedure, you may feel sleepy and have poor judgment.  Have a responsible adult stay with you for at least 24 hours or until you are awake and alert. This information is not intended to replace advice given to you by your health care provider. Make sure you discuss any questions you have with your health care provider. Document Released: 02/04/2016 Document Revised: 01/12/2018 Document Reviewed: 02/04/2016 Elsevier Patient Education  2020 [place] left at office to call patient regarding follow up appointment

## 2019-09-30 NOTE — Anesthesia Preprocedure Evaluation (Signed)
Anesthesia Evaluation  Patient identified by MRN, date of birth, ID band Patient awake    Reviewed: Allergy & Precautions, NPO status , Patient's Chart, lab work & pertinent test results  Airway Mallampati: I  TM Distance: >3 FB Neck ROM: Full    Dental no notable dental hx. (+) Teeth Intact   Pulmonary neg pulmonary ROS,    Pulmonary exam normal breath sounds clear to auscultation       Cardiovascular Exercise Tolerance: Good negative cardio ROS Normal cardiovascular examI Rhythm:Regular Rate:Normal     Neuro/Psych  Neuromuscular disease negative psych ROS   GI/Hepatic negative GI ROS, Neg liver ROS,   Endo/Other  negative endocrine ROSdiabetes, Well Controlled, Type 2, Oral Hypoglycemic Agents  Renal/GU negative Renal ROS  negative genitourinary   Musculoskeletal negative musculoskeletal ROS (+)   Abdominal   Peds negative pediatric ROS (+)  Hematology negative hematology ROS (+)   Anesthesia Other Findings   Reproductive/Obstetrics negative OB ROS                             Anesthesia Physical Anesthesia Plan  ASA: II  Anesthesia Plan: Bier Block and MAC and Bier Block-LIDOCAINE ONLY   Post-op Pain Management:    Induction: Intravenous  PONV Risk Score and Plan: 1 and Midazolam, Treatment may vary due to age or medical condition, TIVA and Propofol infusion  Airway Management Planned: Nasal Cannula and Simple Face Mask  Additional Equipment:   Intra-op Plan:   Post-operative Plan: Extubation in OR  Informed Consent: I have reviewed the patients History and Physical, chart, labs and discussed the procedure including the risks, benefits and alternatives for the proposed anesthesia with the patient or authorized representative who has indicated his/her understanding and acceptance.     Dental advisory given  Plan Discussed with: CRNA  Anesthesia Plan Comments: (Plan  Full PPE use  Plan Bier/MAC as tolerated, as was done with L CTR 09/09/19  with GA vs. GETA as needed d/w pt -WTP with same after Q&A)        Anesthesia Quick Evaluation

## 2019-09-30 NOTE — Interval H&P Note (Signed)
History and Physical Interval Note:  09/30/2019 7:22 AM  Jake Perkins  has presented today for surgery, with the diagnosis of right carpal tunnel syndrome.  The various methods of treatment have been discussed with the patient and family. After consideration of risks, benefits and other options for treatment, the patient has consented to  Procedure(s): RIGHT CARPAL TUNNEL RELEASE (Right) as a surgical intervention.  The patient's history has been reviewed, patient examined, no change in status, stable for surgery.  I have reviewed the patient's chart and labs.  Questions were answered to the patient's satisfaction.     Arther Abbott

## 2019-09-30 NOTE — Progress Notes (Signed)
Pt given 7.5/325 mg Norco tablet and 800 mg Ibuprofen in PACU

## 2019-09-30 NOTE — Brief Op Note (Signed)
09/30/2019  8:11 AM  PATIENT:  Jake Perkins  43 y.o. male  PRE-OPERATIVE DIAGNOSIS:  right carpal tunnel syndrome  POST-OPERATIVE DIAGNOSIS:  right carpal tunnel syndrome  PROCEDURE:  Procedure(s): RIGHT CARPAL TUNNEL RELEASE (Right)   Findings at surgery compression of the median nerve color looked relatively normal compression was directly onto the transverse carpal ligament no synovitis was present  SURGEON:  Surgeon(s) and Role:    Carole Civil, MD - Primary  Date 09/30/2019   PRE-OPERATIVE DIAGNOSIS:  RIGHT  CARPAL TUNNEL SYNDROME  POST-OPERATIVE DIAGNOSIS:  RIGHT CARPAL TUNNEL SYNDROME  PROCEDURE:  Procedure(s): RIGHT CARPAL TUNNEL RELEASE RIGHT   SURGEON:  Surgeon(s) and Role:    * Carole Civil, MD - Primary  PHYSICIAN ASSISTANT:   ASSISTANTS: none   ANESTHESIA:   regional  BLOOD ADMINISTERED:none  DRAINS: none   LOCAL MEDICATIONS USED:  MARCAINE     SPECIMEN:  No Specimen  DISPOSITION OF SPECIMEN:  N/A  COUNTS:  YES  TOURNIQUET:  * Missing tourniquet times found for documented tourniquets in log:  [id number] *  DICTATION: .Viviann Spare Dictation   Surgeon Aline Brochure  Operative findings compression of the RIGHT MEDIAN NERVE COMPRESSION   Indications failure of conservative treatment to relieve pain and paresthesias and numbness and tingling of the RIGHT HAND   The patient was identified in the preop area we confirm the surgical site marked as right wrist. Chart update completed. Patient taken to surgery.  Antibiotic Ancef.  After establishing a successful anesthesia the arm was prepped with ChloraPrep  Timeout executed completed and confirmed site. RIGHT WRIST  /  HAND   A straight incision was made over the RIGHT carpal tunnel in line with the radial border of the ring finger. Blunt dissection was carried out to find the distal aspect of the carpal tunnel. A blunted judgment was passed beneath the carpal tunnel. Sharp incision was then used to  release the transverse carpal ligament. The contents of the carpal tunnel were inspected. The median nerve was compressed, color normal  The wound was irrigated and then closed with 3-0 nylon suture. We injected 10 mL of plain Marcaine on the radial side of the incision  A sterile bandage was applied and the tourniquet was released the color of the hand and capillary refill were normal  The patient was taken to the recovery room in stable condition  PLAN OF CARE: Discharge to home after PACU  PATIENT DISPOSITION:  PACU - hemodynamically stable.   Delay start of Pharmacological VTE agent (>24hrs) due to surgical blood loss or risk of bleeding: not applicablePHYSICIAN ASSISTANT:   ASSISTANTS: none   ANESTHESIA:   regional  EBL:  None    BLOOD ADMINISTERED:none  DRAINS: none   LOCAL MEDICATIONS USED:  MARCAINE     SPECIMEN:  No Specimen  DISPOSITION OF SPECIMEN:  N/A  COUNTS:  YES  TOURNIQUET:   Total Tourniquet Time Documented: Upper Arm (Right) - 31 minutes Total: Upper Arm (Right) - 31 minutes   DICTATION: .Viviann Spare Dictation  PLAN OF CARE: Discharge to home after PACU  PATIENT DISPOSITION:  PACU - hemodynamically stable.   Delay start of Pharmacological VTE agent (>24hrs) due to surgical blood loss or risk of bleeding: not applicable

## 2019-09-30 NOTE — Transfer of Care (Signed)
Immediate Anesthesia Transfer of Care Note  Patient: Jake Perkins  Procedure(s) Performed: RIGHT CARPAL TUNNEL RELEASE (Right )  Patient Location: PACU  Anesthesia Type:MAC and Bier block  Level of Consciousness: awake, alert , oriented and patient cooperative  Airway & Oxygen Therapy: Patient Spontanous Breathing and Patient connected to nasal cannula oxygen  Post-op Assessment: Report given to RN and Post -op Vital signs reviewed and stable  Post vital signs: Reviewed and stable  Last Vitals:  Vitals Value Taken Time  BP    Temp    Pulse 69 09/30/19 0814  Resp    SpO2 98 % 09/30/19 0814  Vitals shown include unvalidated device data.  Last Pain:  Vitals:   09/30/19 0656  TempSrc: Oral  PainSc: 0-No pain      Patients Stated Pain Goal: 5 (90/21/11 5520)  Complications: No apparent anesthesia complications

## 2019-09-30 NOTE — Op Note (Signed)
09/30/2019  8:11 AM  PATIENT:  Jake Perkins  43 y.o. male  PRE-OPERATIVE DIAGNOSIS:  right carpal tunnel syndrome  POST-OPERATIVE DIAGNOSIS:  right carpal tunnel syndrome  PROCEDURE:  Procedure(s): RIGHT CARPAL TUNNEL RELEASE (Right)   Findings at surgery compression of the median nerve color looked relatively normal compression was directly onto the transverse carpal ligament no synovitis was present  SURGEON:  Surgeon(s) and Role:    Carole Civil, MD - Primary  Date 09/30/2019   PRE-OPERATIVE DIAGNOSIS:  RIGHT  CARPAL TUNNEL SYNDROME  POST-OPERATIVE DIAGNOSIS:  RIGHT CARPAL TUNNEL SYNDROME  PROCEDURE:  Procedure(s): RIGHT CARPAL TUNNEL RELEASE RIGHT   SURGEON:  Surgeon(s) and Role:    * Carole Civil, MD - Primary  PHYSICIAN ASSISTANT:   ASSISTANTS: none   ANESTHESIA:   regional  BLOOD ADMINISTERED:none  DRAINS: none   LOCAL MEDICATIONS USED:  MARCAINE     SPECIMEN:  No Specimen  DISPOSITION OF SPECIMEN:  N/A  COUNTS:  YES  TOURNIQUET:  * Missing tourniquet times found for documented tourniquets in log:  [id number] *  DICTATION: .Viviann Spare Dictation   Surgeon Aline Brochure  Operative findings compression of the RIGHT MEDIAN NERVE COMPRESSION   Indications failure of conservative treatment to relieve pain and paresthesias and numbness and tingling of the RIGHT HAND   The patient was identified in the preop area we confirm the surgical site marked as right wrist. Chart update completed. Patient taken to surgery.  Antibiotic Ancef.  After establishing a successful anesthesia the arm was prepped with ChloraPrep  Timeout executed completed and confirmed site. RIGHT WRIST  /  HAND   A straight incision was made over the RIGHT carpal tunnel in line with the radial border of the ring finger. Blunt dissection was carried out to find the distal aspect of the carpal tunnel. A blunted judgment was passed beneath the carpal tunnel. Sharp incision was then used to  release the transverse carpal ligament. The contents of the carpal tunnel were inspected. The median nerve was compressed, color normal  The wound was irrigated and then closed with 3-0 nylon suture. We injected 10 mL of plain Marcaine on the radial side of the incision  A sterile bandage was applied and the tourniquet was released the color of the hand and capillary refill were normal  The patient was taken to the recovery room in stable condition  PLAN OF CARE: Discharge to home after PACU  PATIENT DISPOSITION:  PACU - hemodynamically stable.   Delay start of Pharmacological VTE agent (>24hrs) due to surgical blood loss or risk of bleeding: not applicablePHYSICIAN ASSISTANT:   ASSISTANTS: none   ANESTHESIA:   regional  EBL:  None    BLOOD ADMINISTERED:none  DRAINS: none   LOCAL MEDICATIONS USED:  MARCAINE     SPECIMEN:  No Specimen  DISPOSITION OF SPECIMEN:  N/A  COUNTS:  YES  TOURNIQUET:   Total Tourniquet Time Documented: Upper Arm (Right) - 31 minutes Total: Upper Arm (Right) - 31 minutes   DICTATION: .Viviann Spare Dictation  PLAN OF CARE: Discharge to home after PACU  PATIENT DISPOSITION:  PACU - hemodynamically stable.   Delay start of Pharmacological VTE agent (>24hrs) due to surgical blood loss or risk of bleeding: not applicable

## 2019-09-30 NOTE — Anesthesia Procedure Notes (Signed)
Anesthesia Regional Block: Bier block (IV Regional)   Pre-Anesthetic Checklist: ,, timeout performed, Correct Patient, Correct Site, Correct Laterality, Correct Procedure, Correct Position, site marked, Risks and benefits discussed, Surgical consent, Pre-op evaluation  Laterality: Right      Procedures:,,,,, intact distal pulses, Esmarch exsanguination, single tourniquet utilized,  Narrative:  Start time: 09/30/2019 7:36 AM CRNA: Georgeanne Nim, CRNA  Additional Notes: Rt arm cuff checked prior to inflating for block with neg pulse. Rt arm wrapped with eschmark and exsanguinated and tourniquet inflated with neg pulse post inflation. 50cc .5 percent preservative free lidocaine injected with ease. Tolerated well with venous engorgement noted post injection.

## 2019-09-30 NOTE — Anesthesia Postprocedure Evaluation (Signed)
Anesthesia Post Note  Patient: Jake Perkins  Procedure(s) Performed: RIGHT CARPAL TUNNEL RELEASE (Right )  Patient location during evaluation: PACU Anesthesia Type: MAC and Bier Block Level of consciousness: awake and alert Pain management: pain level controlled Vital Signs Assessment: post-procedure vital signs reviewed and stable Respiratory status: spontaneous breathing Cardiovascular status: stable Postop Assessment: no apparent nausea or vomiting Anesthetic complications: no     Last Vitals:  Vitals:   09/30/19 0656  BP: (!) 150/92  Pulse: 60  Resp: 20  Temp: 36.7 C  SpO2: 100%    Last Pain:  Vitals:   09/30/19 0656  TempSrc: Oral  PainSc: 0-No pain                 Everette Rank

## 2019-10-01 ENCOUNTER — Encounter (HOSPITAL_COMMUNITY): Payer: Self-pay | Admitting: Orthopedic Surgery

## 2019-10-06 ENCOUNTER — Encounter (HOSPITAL_COMMUNITY): Payer: Self-pay | Admitting: Orthopedic Surgery

## 2019-10-08 ENCOUNTER — Encounter: Payer: Self-pay | Admitting: Orthopedic Surgery

## 2019-10-08 ENCOUNTER — Other Ambulatory Visit: Payer: Self-pay

## 2019-10-08 ENCOUNTER — Ambulatory Visit (INDEPENDENT_AMBULATORY_CARE_PROVIDER_SITE_OTHER): Payer: Managed Care, Other (non HMO) | Admitting: Orthopedic Surgery

## 2019-10-08 VITALS — Temp 98.4°F | Wt 167.0 lb

## 2019-10-08 DIAGNOSIS — Z9889 Other specified postprocedural states: Secondary | ICD-10-CM

## 2019-10-08 DIAGNOSIS — T8131XA Disruption of external operation (surgical) wound, not elsewhere classified, initial encounter: Secondary | ICD-10-CM

## 2019-10-08 NOTE — Progress Notes (Signed)
Chief Complaint  Patient presents with  . Carpal Tunnel    Bilateral/doing good/hurts when trying to use DOS 09/30/19    2 surgeries were done.  On December 3 we had a right carpal tunnel release  Wound check today.  Patient had more pain on the right than he did on his left when he had surgery.  We changed his dressing we will continue active range of motion exercises  The left carpal tunnel release was done on November 12 the wound opened up superficially he is treating that with peroxide and Neosporin.  He is concerned about not being able to open tight jars but his grip strength is actually very good his wound looks fine  We will follow-up on the 18th to check the right wrist and hand to see if the sutures can be removed if not we can do it on the 21st  Encounter Diagnoses  Name Primary?  . S/P carpal tunnel release left 09/09/2019 Yes  . Postoperative dehiscence of skin wound, initial encounter   . Status post carpal tunnel release December 3 right wrist

## 2019-10-15 ENCOUNTER — Encounter: Payer: Self-pay | Admitting: Orthopedic Surgery

## 2019-10-15 ENCOUNTER — Other Ambulatory Visit: Payer: Self-pay

## 2019-10-15 ENCOUNTER — Ambulatory Visit (INDEPENDENT_AMBULATORY_CARE_PROVIDER_SITE_OTHER): Payer: Managed Care, Other (non HMO) | Admitting: Orthopedic Surgery

## 2019-10-15 DIAGNOSIS — Z9889 Other specified postprocedural states: Secondary | ICD-10-CM

## 2019-10-15 NOTE — Progress Notes (Signed)
Chief Complaint  Patient presents with  . Routine Post Op    09/09/19 left CTR 09/30/19 right    Jake Perkins is status post left carpal tunnel release on November 12 complicated by wound opening he has since closed there are no signs of infection he has some discomfort intermittently has difficulty opening a jar but his numbness and tingling have resolved  On December 30 had a right carpal tunnel release and would like the sutures removed at a later date and we have settled on December 23 that wound looks good his symptoms are minimal  Status post bilateral carpal tunnel release come back on December 23 for suture removal

## 2019-10-20 ENCOUNTER — Other Ambulatory Visit: Payer: Self-pay

## 2019-10-20 ENCOUNTER — Ambulatory Visit (INDEPENDENT_AMBULATORY_CARE_PROVIDER_SITE_OTHER): Payer: Managed Care, Other (non HMO) | Admitting: Orthopedic Surgery

## 2019-10-20 ENCOUNTER — Encounter: Payer: Self-pay | Admitting: Orthopedic Surgery

## 2019-10-20 DIAGNOSIS — Z9889 Other specified postprocedural states: Secondary | ICD-10-CM

## 2019-10-20 NOTE — Progress Notes (Signed)
Status post right carpal tunnel release on December 3  We took out some of his sutures but wound does not appear to be ready for although the sutures to be removed  There are no signs of infection  We will follow up on Monday to get the remaining 3 sutures removed

## 2019-10-25 ENCOUNTER — Encounter: Payer: Self-pay | Admitting: Orthopedic Surgery

## 2019-10-25 ENCOUNTER — Ambulatory Visit (INDEPENDENT_AMBULATORY_CARE_PROVIDER_SITE_OTHER): Payer: Managed Care, Other (non HMO) | Admitting: Orthopedic Surgery

## 2019-10-25 DIAGNOSIS — Z9889 Other specified postprocedural states: Secondary | ICD-10-CM

## 2019-10-25 NOTE — Progress Notes (Signed)
Status post right carpal tunnel release on December 3 we took out the remaining sutures today the wound still opened up some he will continue with dressing changes Neosporin  He says his numbness and tingling are resolved on both sides of strength is improving  Follow-up right carpal tunnel release wound check next 2 weeks

## 2019-11-08 ENCOUNTER — Ambulatory Visit: Payer: Managed Care, Other (non HMO) | Admitting: Orthopedic Surgery

## 2019-11-08 ENCOUNTER — Encounter: Payer: Self-pay | Admitting: Orthopedic Surgery

## 2020-05-31 IMAGING — DX RIGHT HAND - COMPLETE 3+ VIEW
3 series · 3 of 3 positions shown · non-contrast
Comparison: None.

CLINICAL DATA: 42-year-old male status post blunt trauma, hand
slammed in car door. First and 2nd digit pain radiating to the arm.

EXAM:
RIGHT HAND - COMPLETE 3+ VIEW

[hand pa]
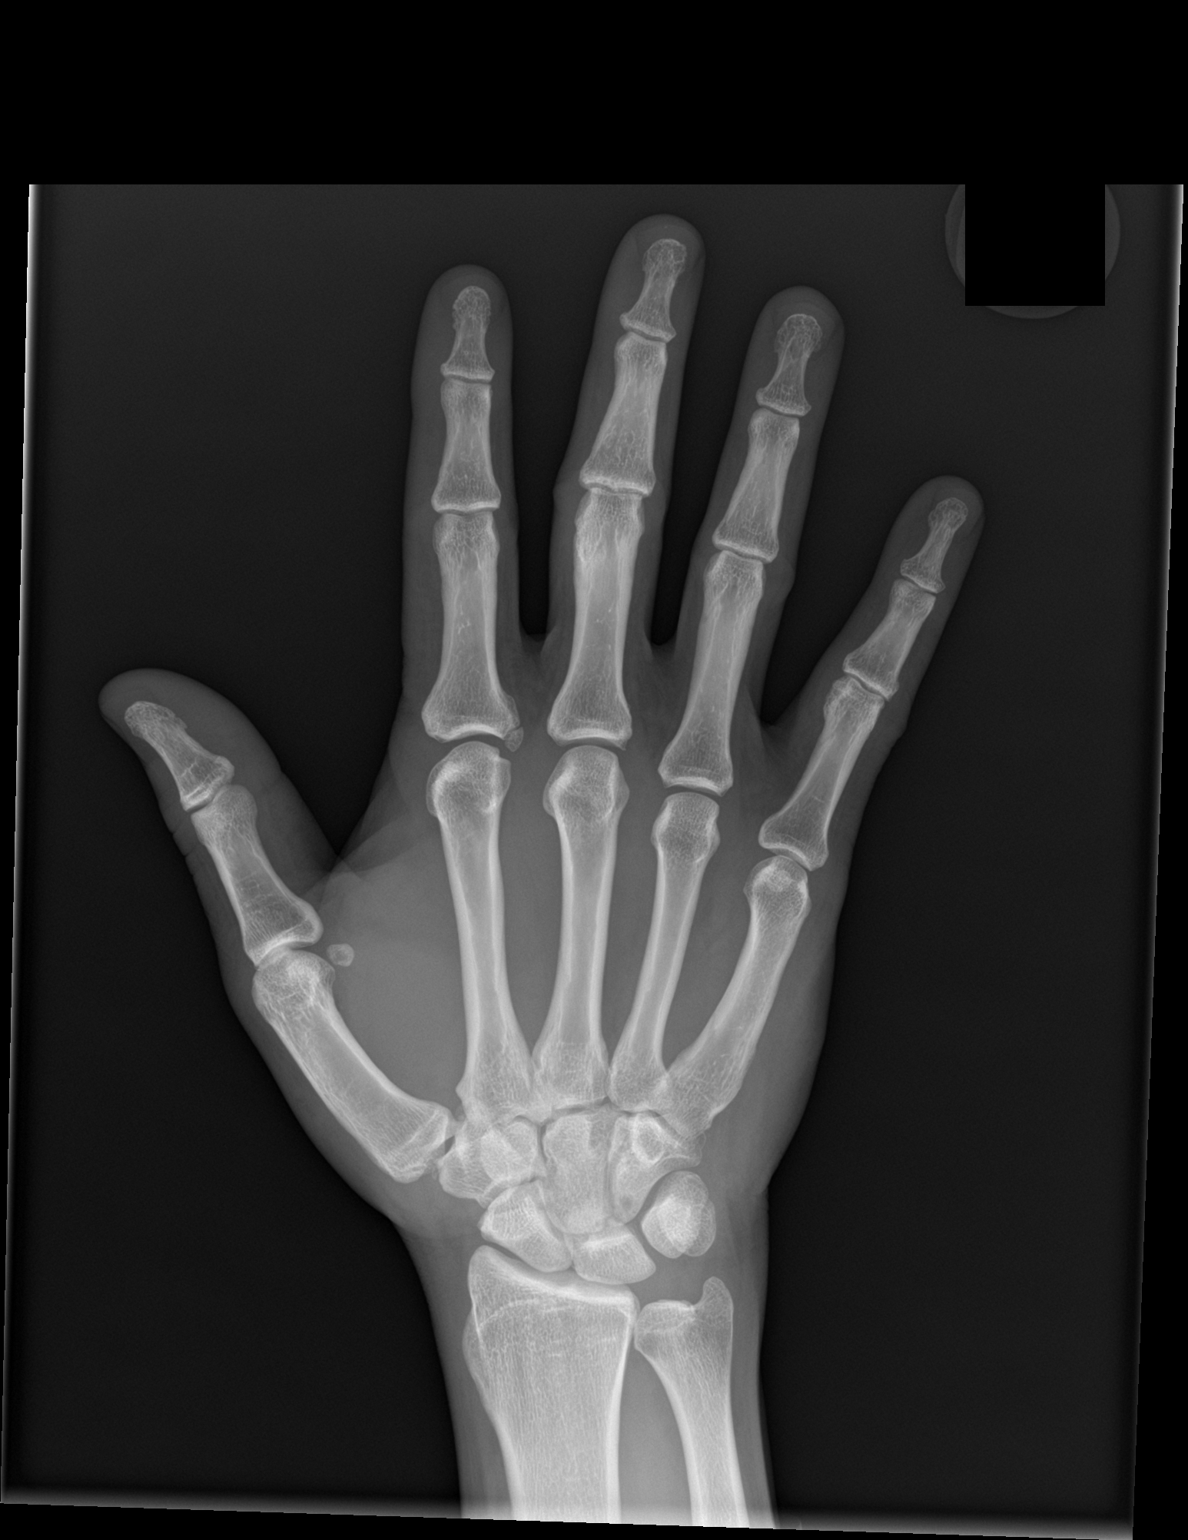

[hand obl]
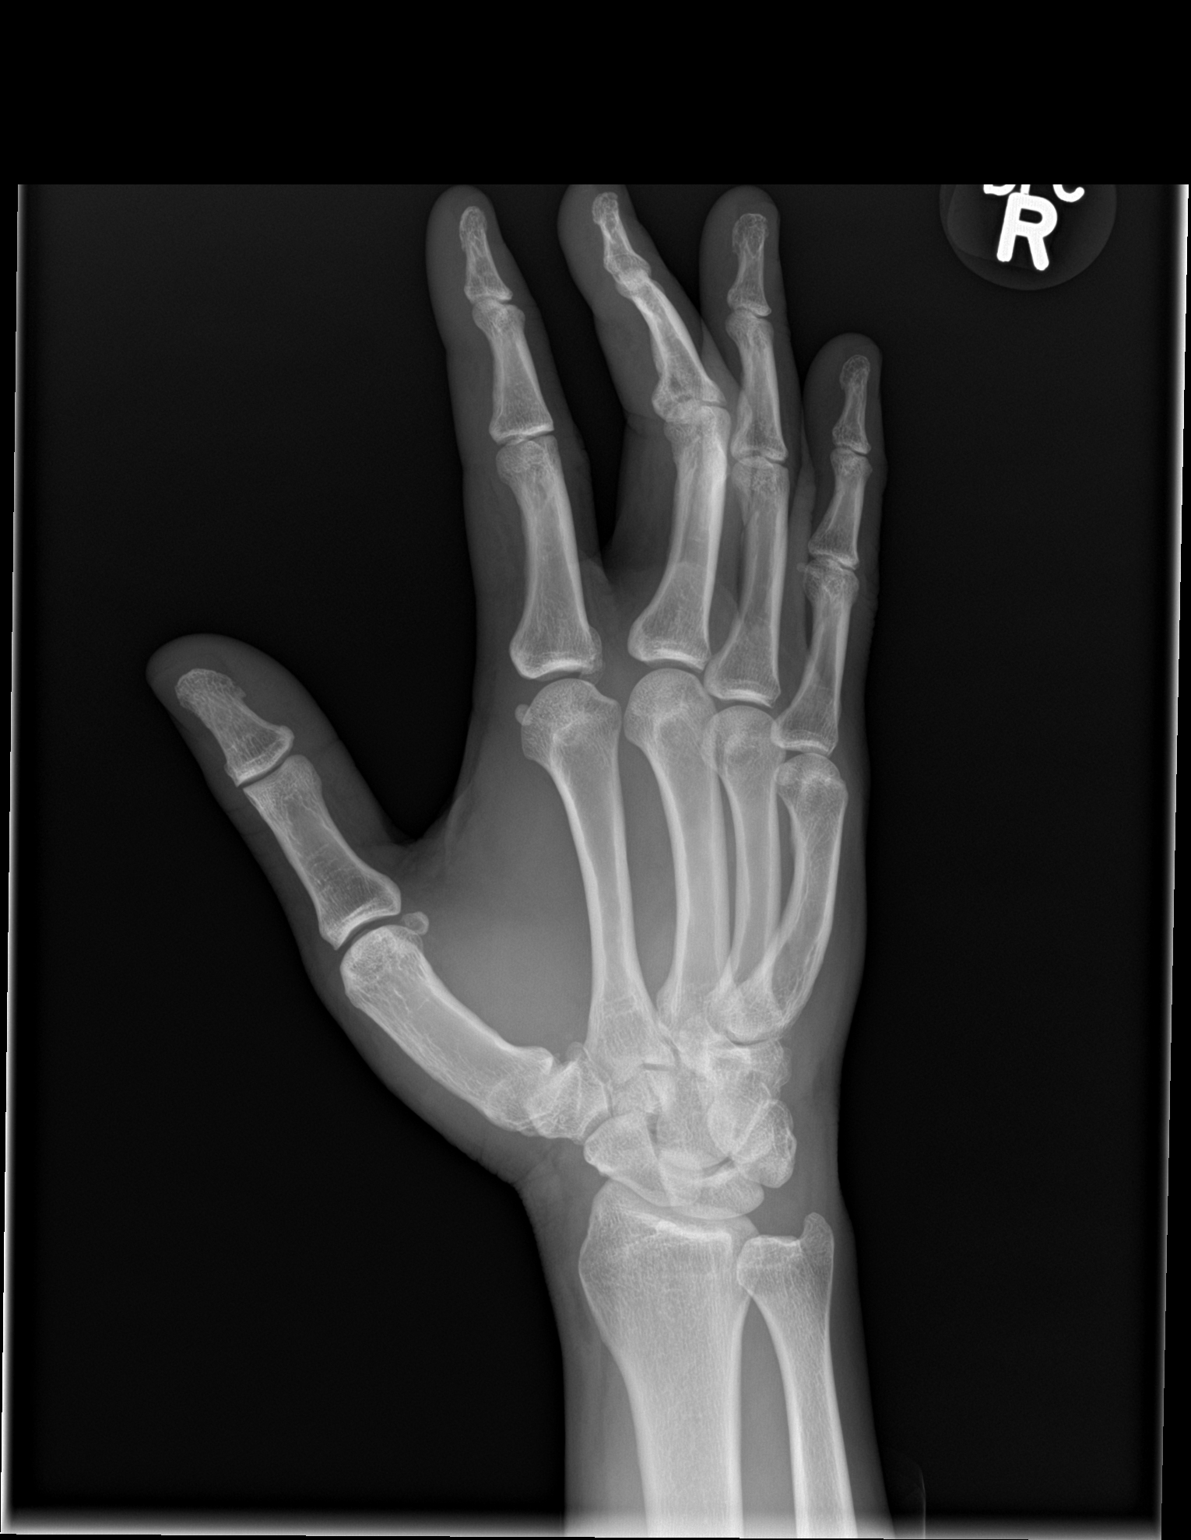

[hand lat]
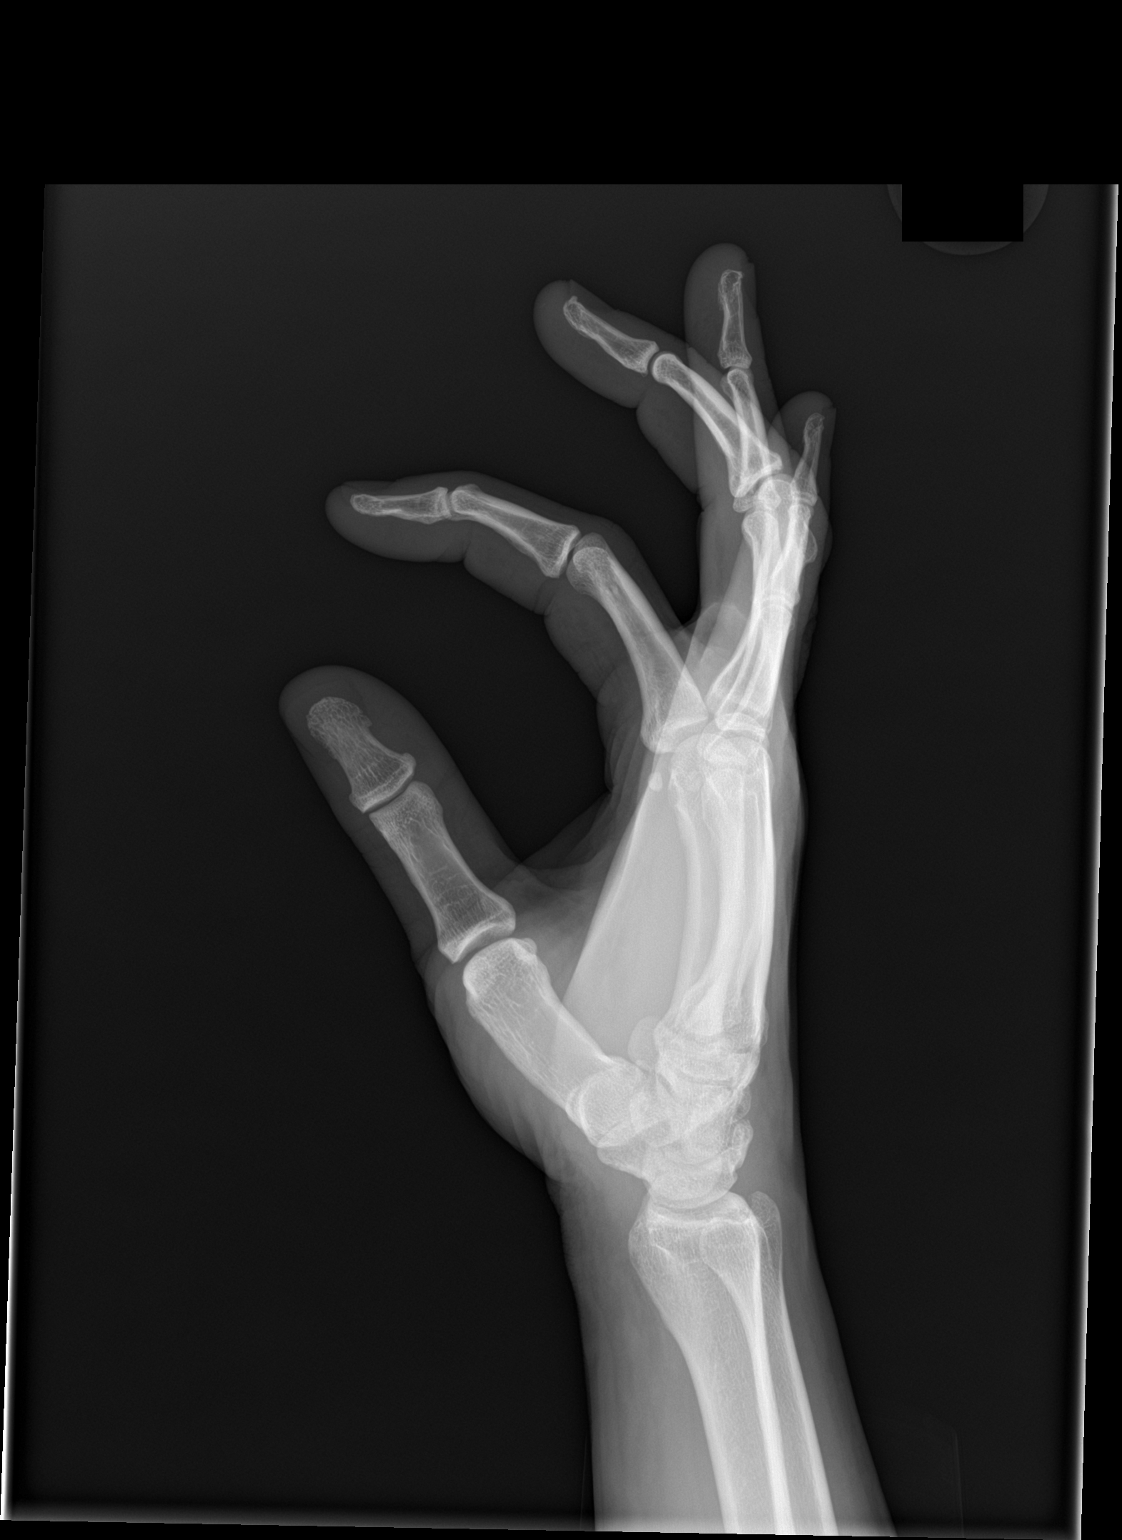

[3 of 3 positions shown; findings below may reference images not displayed]

FINDINGS: Bone mineralization is within normal limits. Intact distal radius
and ulna. Normal carpal bone alignment and joint spaces. Metacarpals
appear intact. There is a chronic appearing ossific fragment at the
ulnar base of the 2nd proximal phalanx. The 2nd MCP joint space
there is preserved. No superimposed acute fracture identified. No
discrete soft tissue injury.
IMPRESSION: No acute fracture or dislocation identified in the right hand. Small
chronic appearing fragment at the base of the 2nd proximal phalanx.

## 2021-01-26 ENCOUNTER — Emergency Department (HOSPITAL_COMMUNITY)
Admission: EM | Admit: 2021-01-26 | Discharge: 2021-01-26 | Disposition: A | Discharge status: Home / Self Care | Payer: Managed Care, Other (non HMO) | Attending: Emergency Medicine | Admitting: Emergency Medicine

## 2021-01-26 ENCOUNTER — Encounter (HOSPITAL_COMMUNITY): Payer: Self-pay | Admitting: *Deleted

## 2021-01-26 ENCOUNTER — Other Ambulatory Visit: Payer: Self-pay

## 2021-01-26 DIAGNOSIS — N50819 Testicular pain, unspecified: Secondary | ICD-10-CM | POA: Diagnosis not present

## 2021-01-26 DIAGNOSIS — M545 Low back pain, unspecified: Secondary | ICD-10-CM | POA: Diagnosis not present

## 2021-01-26 DIAGNOSIS — E119 Type 2 diabetes mellitus without complications: Secondary | ICD-10-CM | POA: Insufficient documentation

## 2021-01-26 DIAGNOSIS — R1030 Lower abdominal pain, unspecified: Secondary | ICD-10-CM | POA: Diagnosis present

## 2021-01-26 DIAGNOSIS — Z7984 Long term (current) use of oral hypoglycemic drugs: Secondary | ICD-10-CM | POA: Diagnosis not present

## 2021-01-26 DIAGNOSIS — R1084 Generalized abdominal pain: Secondary | ICD-10-CM | POA: Insufficient documentation

## 2021-01-26 LAB — COMPREHENSIVE METABOLIC PANEL
ALT: 28 U/L (ref 0–44)
AST: 20 U/L (ref 15–41)
Albumin: 4.2 g/dL (ref 3.5–5.0)
Alkaline Phosphatase: 40 U/L (ref 38–126)
Anion gap: 9 (ref 5–15)
BUN: 11 mg/dL (ref 6–20)
CO2: 24 mmol/L (ref 22–32)
Calcium: 9 mg/dL (ref 8.9–10.3)
Chloride: 103 mmol/L (ref 98–111)
Creatinine, Ser: 0.93 mg/dL (ref 0.61–1.24)
GFR, Estimated: >60 mL/min (ref >60)
Glucose, Bld: 173 mg/dL — ABNORMAL HIGH (ref 70–99)
Potassium: 3.9 mmol/L (ref 3.5–5.1)
Sodium: 136 mmol/L (ref 135–145)
Total Bilirubin: 0.8 mg/dL (ref 0.3–1.2)
Total Protein: 7.1 g/dL (ref 6.5–8.1)

## 2021-01-26 LAB — CBC WITH DIFFERENTIAL/PLATELET
Abs Immature Granulocytes: 0.01 10*3/uL (ref 0.00–0.07)
Basophils Absolute: 0 10*3/uL (ref 0.0–0.1)
Basophils Relative: 0 %
Eosinophils Absolute: 0.1 10*3/uL (ref 0.0–0.5)
Eosinophils Relative: 2 %
HCT: 39.2 % (ref 39.0–52.0)
Hemoglobin: 12.6 g/dL — ABNORMAL LOW (ref 13.0–17.0)
Immature Granulocytes: 0 %
Lymphocytes Relative: 42 %
Lymphs Abs: 2.2 10*3/uL (ref 0.7–4.0)
MCH: 28 pg (ref 26.0–34.0)
MCHC: 32.1 g/dL (ref 30.0–36.0)
MCV: 87.1 fL (ref 80.0–100.0)
Monocytes Absolute: 0.2 10*3/uL (ref 0.1–1.0)
Monocytes Relative: 5 %
Neutro Abs: 2.7 10*3/uL (ref 1.7–7.7)
Neutrophils Relative %: 51 %
Platelets: 237 10*3/uL (ref 150–400)
RBC: 4.5 MIL/uL (ref 4.22–5.81)
RDW: 13.1 % (ref 11.5–15.5)
WBC: 5.2 10*3/uL (ref 4.0–10.5)
nRBC: 0 % (ref 0.0–0.2)

## 2021-01-26 LAB — URINALYSIS, ROUTINE W REFLEX MICROSCOPIC
Bilirubin Urine: NEGATIVE
Glucose, UA: 50 mg/dL — AB
Hgb urine dipstick: NEGATIVE
Ketones, ur: NEGATIVE mg/dL
Leukocytes,Ua: NEGATIVE
Nitrite: NEGATIVE
Protein, ur: NEGATIVE mg/dL
Specific Gravity, Urine: 1.009 (ref 1.005–1.030)
pH: 6 (ref 5.0–8.0)

## 2021-01-26 LAB — LIPASE, BLOOD: Lipase: 41 U/L (ref 11–51)

## 2021-01-26 MED ORDER — DICYCLOMINE HCL 20 MG PO TABS: 20.0000 mg | ORAL_TABLET | Freq: Two times a day (BID) | ORAL | 0 refills | Status: AC

## 2021-01-26 MED ORDER — DICYCLOMINE HCL 10 MG/ML IM SOLN
20.0000 mg | Freq: Once | INTRAMUSCULAR | Status: AC
  Administered 2021-01-26: 20 mg via INTRAMUSCULAR
  Filled 2021-01-26: qty 2

## 2021-01-26 NOTE — ED Triage Notes (Signed)
C/o abdominal pain radiating into groin area

## 2021-01-26 NOTE — ED Provider Notes (Signed)
[facility] EMERGENCY DEPARTMENT Provider Note   CSN: [id number] Arrival date & time: 01/26/21  1303     History Chief Complaint  Patient presents with  . Abdominal Pain    Jake Perkins is a 45 y.o. male.  HPI   Patient with significant medical history of diabetes presents to the emergency department with chief complaint of right lower back pain and lower abdominal pain.  He endorses that pain has been intermittent for the last couple months, but over last week the pain has become more consistent and increased in intensity.  Describes it as a achy-like sensation which will start in his right lower back and then radiate into his lower abdomen sometimes  go down to his right groin.  He has no associated nausea, vomiting, diarrhea, constipation, urinary symptoms, denies penile discharge, has occasional testicular pain.  Endorse that the pain will come on suddenly, lasts approximately few minutes to an hour and then resolve on its own, not exacerbated by food, states it comes on randomly, denies alleviating factors.  Patient has no symptom abdominal history, no history of kidney stones, stomach ulcers, diverticulitis, bowel obstruction, connective tissue disorders, AAA denies alcohol use, NSAID use, nicotine use.  Patient denies headaches, fevers, chills, shortness of breath, chest pain, nausea, vomiting, diarrhea, worsening pedal edema.  Past Medical History:  Diagnosis Date  . Carpal tunnel syndrome, bilateral   . Diabetes mellitus     Patient Active Problem List   Diagnosis Date Noted  . Carpal tunnel syndrome of right wrist   . S/P carpal tunnel release left 09/09/2019 right 09/30/2019 09/22/2019  . Carpal tunnel syndrome of left wrist   . Diabetes mellitus ([place]) 07/09/2018    Past Surgical History:  Procedure Laterality Date  . CARPAL TUNNEL RELEASE Right 09/30/2019   Procedure: RIGHT CARPAL TUNNEL RELEASE;  Surgeon: Carole Civil, MD;  Location: [facility];  Service:  Orthopedics;  Laterality: Right;  . CARPAL TUNNEL RELEASE Left 09/09/2019   Procedure: LEFT CARPAL TUNNEL RELEASE;  Surgeon: Carole Civil, MD;  Location: [facility];  Service: Orthopedics;  Laterality: Left;       Family History  Problem Relation Age of Onset  . Diabetes Mother   . Diabetes Father     Social History   Tobacco Use  . Smoking status: Never Smoker  . Smokeless tobacco: Never Used  Vaping Use  . Vaping Use: Never used  Substance Use Topics  . Alcohol use: No  . Drug use: No    Home Medications Prior to Admission medications   Medication Sig Start Date End Date Taking? Authorizing Provider  dicyclomine (BENTYL) 20 MG tablet Take 1 tablet (20 mg total) by mouth 2 (two) times daily. 01/26/21  Yes Marcello Fennel, PA-C  acetaminophen-codeine (TYLENOL #3) 300-30 MG tablet Take 1 tablet by mouth every 6 (six) hours as needed for moderate pain. 09/30/19   Carole Civil, MD  Cholecalciferol (VITAMIN D3 PO) Take 1 tablet by mouth daily.    [provider]  dapagliflozin propanediol (FARXIGA) 10 MG TABS tablet Take 10 mg by mouth every other day.    [provider]  diclofenac (VOLTAREN) 75 MG EC tablet Take 75 mg by mouth 2 (two) times daily.    [provider]  metFORMIN (GLUCOPHAGE) 1000 MG tablet Take 1,000 mg by mouth 2 (two) times daily. 03/06/19   [provider]  Omega-3 Fatty Acids (FISH OIL PO) Take 1 capsule by mouth daily.  [provider]    Allergies    Jentadueto [linagliptin-metformin hcl er]  Review of Systems   Review of Systems  Constitutional: Negative for chills and fever.  HENT: Negative for congestion and sore throat.   Eyes: Negative for visual disturbance.  Respiratory: Negative for shortness of breath.   Cardiovascular: Negative for chest pain.  Gastrointestinal: Positive for abdominal pain. Negative for diarrhea, nausea and vomiting.  Genitourinary: Positive for flank pain and  testicular pain. Negative for enuresis, hematuria, penile discharge and scrotal swelling.  Musculoskeletal: Negative for back pain.  Skin: Negative for rash.  Neurological: Negative for dizziness and headaches.  Hematological: Does not bruise/bleed easily.    Physical Exam Updated Vital Signs BP (!) 134/94 (BP Location: Right Arm)   Pulse 85   Temp 98 F (36.7 C)   Resp 16   SpO2 98%   Physical Exam Vitals and nursing note reviewed. Exam conducted with a chaperone present.  Constitutional:      General: He is not in acute distress.    Appearance: He is not ill-appearing.  HENT:     Head: Normocephalic and atraumatic.     Nose: No congestion.     Mouth/Throat:     Mouth: Mucous membranes are moist.     Pharynx: Oropharynx is clear. No oropharyngeal exudate or posterior oropharyngeal erythema.  Eyes:     Conjunctiva/sclera: Conjunctivae normal.  Cardiovascular:     Rate and Rhythm: Normal rate and regular rhythm.     Pulses: Normal pulses.     Heart sounds: No murmur heard. No friction rub. No gallop.   Pulmonary:     Effort: No respiratory distress.     Breath sounds: No wheezing, rhonchi or rales.  Abdominal:     Palpations: Abdomen is soft.     Tenderness: There is abdominal tenderness. There is no right CVA tenderness or left CVA tenderness.     Comments: Patient's abdomen was visualized, is nondistended, normoactive bowel sounds, dull to percussion.  He had slight tenderness to palpation his lower abdomen, no rebound tenderness, no peritoneal, no McBurney point or Murphy sign.  Genitourinary:    Penis: Normal.      Testes: Normal.     Comments: With chaperone was present genital exam was performed, there is no noted rashes, penile discharge present, no bell clap deformities, testicles were palpated nontender to palpation, no hernias present during genital exam Musculoskeletal:     Right lower leg: No edema.     Left lower leg: No edema.  Skin:    General: Skin is  warm and dry.  Neurological:     Mental Status: He is alert.  Psychiatric:        Mood and Affect: Mood normal.     ED Results / Procedures / Treatments   Labs (all labs ordered are listed, but only abnormal results are displayed) Labs Reviewed  COMPREHENSIVE METABOLIC PANEL - Abnormal; Notable for the following components:      Result Value   Glucose, Bld 173 (*)    All other components within normal limits  CBC WITH DIFFERENTIAL/PLATELET - Abnormal; Notable for the following components:   Hemoglobin 12.6 (*)    All other components within normal limits  URINALYSIS, ROUTINE W REFLEX MICROSCOPIC - Abnormal; Notable for the following components:   Color, Urine STRAW (*)    Glucose, UA 50 (*)    All other components within normal limits  LIPASE, BLOOD    EKG None  Radiology  No results found.  Procedures Procedures   Medications Ordered in ED Medications  dicyclomine (BENTYL) injection 20 mg (20 mg Intramuscular Given 01/26/21 1356)    ED Course  I have reviewed the triage vital signs and the nursing notes.  Pertinent labs & imaging results that were available during my care of the patient were reviewed by me and considered in my medical decision making (see chart for details).    MDM Rules/Calculators/A&P                         Initial impression-patient presents with lower abdominal pain.  He is alert, does not appear in acute distress, vital signs reassuring.  Will obtain basic lab work, provide patient with Bentyl and reassess.  Work-up-CBC shows normocytic anemia 12.6, lipase 41, CMP shows hyperglycemia 173.  UA negative for signs of infection.  Reassessment- patient reassessed after providing him with Bentyl, has no pain at this time, abdomen was palpated nontender to palpation.  Vital signs remained stable.  Rule out-I have low suspicion for perforated stomach ulcer as abdomen is nondistended, dull to percussion, no peritoneal signs on my exam.  Low suspicion  for UTI, [place], kidney stone as patient has no CVA tenderness, urine is negative for signs of infection, no hematuria present.  Low suspicion for AAA as patient has low risk factors, history is atypical, no pulsatile mass on my exam.  Low suspicion for ventral hernia, inguinal hernia as there is no mass noted on abdominal exam, no bulging mass noted on general exam.  Low suspicion for testicular torsion as there is no bell clap deformity, no tenderness to palpation of the testicles.  Low suspicion for liver or gallbladder abnormalities as there is no elevation in liver enzymes or alk phos, no right upper quadrant pain noted.  Plan-patient has lower abdominal pain with unknown etiology differential diagnosis includes constipation, slow bowel transit, muscular strain, neuropathy.  Will prescribe patient Bentyl, recommend increase fiber and water intake follow-up with GI for further evaluation.  Vital signs have remained stable, no indication for hospital admission. Patient given at home care as well strict return precautions.  Patient verbalized that they understood agreed to said plan.   Final Clinical Impression(s) / ED Diagnoses Final diagnoses:  Generalized abdominal pain    Rx / DC Orders ED Discharge Orders         Ordered    dicyclomine (BENTYL) 20 MG tablet  2 times daily        01/26/21 1524           Aron Baba 01/26/21 1531    Isla Pence, MD 01/29/21 1459

## 2021-01-26 NOTE — Discharge Instructions (Addendum)
Your exam and lab work look reassuring.  I have given you a medication called Bentyl this will help with abdominal pain please use as needed.  I recommend increasing your intake of water, increasing your fiber intake, exercising as this can help with abdominal pain.  I want you to follow-up with GI for further evaluation given contact information above  Come back to the emergency department if you develop chest pain, shortness of breath, severe abdominal pain, uncontrolled nausea, vomiting, diarrhea.

## 2022-02-25 ENCOUNTER — Emergency Department (HOSPITAL_COMMUNITY)
Admission: EM | Admit: 2022-02-25 | Discharge: 2022-02-26 | Disposition: A | Discharge status: Home / Self Care | Payer: Managed Care, Other (non HMO) | Attending: Emergency Medicine | Admitting: Emergency Medicine

## 2022-02-25 ENCOUNTER — Encounter (HOSPITAL_COMMUNITY): Payer: Self-pay | Admitting: Emergency Medicine

## 2022-02-25 DIAGNOSIS — R300 Dysuria: Secondary | ICD-10-CM | POA: Insufficient documentation

## 2022-02-25 DIAGNOSIS — Z7984 Long term (current) use of oral hypoglycemic drugs: Secondary | ICD-10-CM | POA: Insufficient documentation

## 2022-02-25 DIAGNOSIS — N5089 Other specified disorders of the male genital organs: Secondary | ICD-10-CM | POA: Diagnosis present

## 2022-02-25 DIAGNOSIS — E119 Type 2 diabetes mellitus without complications: Secondary | ICD-10-CM | POA: Diagnosis not present

## 2022-02-25 DIAGNOSIS — N452 Orchitis: Secondary | ICD-10-CM | POA: Insufficient documentation

## 2022-02-25 DIAGNOSIS — Z8546 Personal history of malignant neoplasm of prostate: Secondary | ICD-10-CM | POA: Insufficient documentation

## 2022-02-25 NOTE — ED Triage Notes (Signed)
Pt c/o lower back pain for 2 weeks. Today pt noticed that his left testicle is swollen with pain going up to his lower abd. ?

## 2022-02-26 LAB — URINALYSIS, ROUTINE W REFLEX MICROSCOPIC
Bilirubin Urine: NEGATIVE
Glucose, UA: 50 mg/dL — AB
Ketones, ur: 5 mg/dL — AB
Nitrite: POSITIVE — AB
Protein, ur: NEGATIVE mg/dL
Specific Gravity, Urine: 1.02 (ref 1.005–1.030)
pH: 5 (ref 5.0–8.0)

## 2022-02-26 MED ORDER — LEVOFLOXACIN 500 MG PO TABS
500.0000 mg | ORAL_TABLET | Freq: Once | ORAL | Status: AC
  Administered 2022-02-26: 500 mg via ORAL
  Filled 2022-02-26: qty 1

## 2022-02-26 MED ORDER — LEVOFLOXACIN 500 MG PO TABS: 500.0000 mg | ORAL_TABLET | Freq: Every day | ORAL | 0 refills | Status: AC

## 2022-02-26 MED ORDER — HYDROCODONE-ACETAMINOPHEN 5-325 MG PO TABS: 1.0000 | ORAL_TABLET | Freq: Four times a day (QID) | ORAL | 0 refills | Status: AC | PRN

## 2022-02-26 NOTE — ED Provider Notes (Signed)
? ?[facility]  ?Provider Note ? ?CSN: [id number] ?Arrival date & time: 02/25/22 2230 ? ?History ?Chief Complaint  ?Patient presents with  ? Testicle Pain  ? ? ?Jake Perkins is a 46 y.o. male reports several days of dysuria and now R testicular swelling. Not sudden in onset and no injuries. He has had UTI recently, attributed to his DM. He also reports several weeks ago, a urologist in [place] did a prostate biopsy which was positive for cancer, but isolated to the prostate and not recommended for removal. He would like to see a urologist here for a second opinion. He denies any fevers. No penile discharge or concern for STI. He has had some low back pain for several weeks as well.  ? ? ?Home Medications ?Prior to Admission medications   ?Medication Sig Start Date End Date Taking? Authorizing Provider  ?HYDROcodone-acetaminophen (NORCO/VICODIN) 5-325 MG tablet Take 1 tablet by mouth every 6 (six) hours as needed. 02/26/22  Yes Truddie Hidden, MD  ?levofloxacin (LEVAQUIN) 500 MG tablet Take 1 tablet (500 mg total) by mouth daily for 10 days. 02/26/22 03/08/22 Yes Truddie Hidden, MD  ?acetaminophen-codeine (TYLENOL #3) 300-30 MG tablet Take 1 tablet by mouth every 6 (six) hours as needed for moderate pain. 09/30/19   Carole Civil, MD  ?Cholecalciferol (VITAMIN D3 PO) Take 1 tablet by mouth daily.    [provider]  ?dapagliflozin propanediol (FARXIGA) 10 MG TABS tablet Take 10 mg by mouth every other day.    [provider]  ?diclofenac (VOLTAREN) 75 MG EC tablet Take 75 mg by mouth 2 (two) times daily.    [provider]  ?dicyclomine (BENTYL) 20 MG tablet Take 1 tablet (20 mg total) by mouth 2 (two) times daily. 01/26/21   Marcello Fennel, PA-C  ?metFORMIN (GLUCOPHAGE) 1000 MG tablet Take 1,000 mg by mouth 2 (two) times daily. 03/06/19   [provider]  ?Omega-3 Fatty Acids (FISH OIL PO) Take 1 capsule by mouth daily.    [provider]   ? ? ? ?Allergies    ?Jentadueto [linagliptin-metformin hcl er] ? ? ?Review of Systems   ?Review of Systems ?Please see HPI for pertinent positives and negatives ? ?Physical Exam ?BP (!) 140/101 (BP Location: Right Arm)   Pulse 96   Temp 98.3 ?F (36.8 ?C) (Oral)   Resp 17   Ht '5\' 6"'$  (1.676 m)   Wt 75.8 kg   SpO2 99%   BMI 26.97 kg/m?  ? ?Physical Exam ?Vitals and nursing note reviewed.  ?Constitutional:   ?   Appearance: Normal appearance.  ?HENT:  ?   Head: Normocephalic and atraumatic.  ?   Nose: Nose normal.  ?   Mouth/Throat:  ?   Mouth: Mucous membranes are moist.  ?Eyes:  ?   Extraocular Movements: Extraocular movements intact.  ?   Conjunctiva/sclera: Conjunctivae normal.  ?Cardiovascular:  ?   Rate and Rhythm: Normal rate.  ?Pulmonary:  ?   Effort: Pulmonary effort is normal.  ?   Breath sounds: Normal breath sounds.  ?Abdominal:  ?   General: Abdomen is flat.  ?   Palpations: Abdomen is soft.  ?   Tenderness: There is no abdominal tenderness.  ?Genitourinary: ?   Comments: Penis is normal, no discharge, R testicular is swollen and tender.  ?Musculoskeletal:     ?   General: No swelling. Normal range of motion.  ?   Cervical back: Neck supple.  ?  Skin: ?   General: Skin is warm and dry.  ?   Findings: No rash (on exposed skin).  ?Neurological:  ?   General: No focal deficit present.  ?   Mental Status: He is alert and oriented to person, place, and time.  ?Psychiatric:     ?   Mood and Affect: Mood normal.  ? ? ?ED Results / Procedures / Treatments   ?EKG ?None ? ?Procedures ?Procedures ? ?Medications Ordered in the ED ?Medications  ?levofloxacin (LEVAQUIN) tablet 500 mg (has no administration in time range)  ? ? ?Initial Impression and Plan ? Patient with symptoms consistent epididymo-orchitis. Low suspicion for torsion. UA is consistent with infection. Will need urgent US tomorrow but does not need emergent [place] tonight. Will begin Levaquin for his infection. Pain meds for comfort and referral to  Urology for follow up.  ? ?ED Course  ? ?  ? ? ?MDM Rules/Calculators/A&P ?Medical Decision Making ?Problems Addressed: ?Orchitis: acute illness or injury ? ?Amount and/or Complexity of Data Reviewed ?Labs: ordered. Decision-making details documented in ED Course. ? ?Risk ?Prescription drug management. ? ? ? ?Final Clinical Impression(s) / ED Diagnoses ?Final diagnoses:  ?Orchitis  ? ? ?Rx / DC Orders ?ED Discharge Orders   ? ?      Ordered  ?  US Scrotum       ? 02/26/22 0208  ?  levofloxacin (LEVAQUIN) 500 MG tablet  Daily       ? 02/26/22 0208  ?  HYDROcodone-acetaminophen (NORCO/VICODIN) 5-325 MG tablet  Every 6 hours PRN       ? 02/26/22 0208  ? ?  ?  ? ?  ? ?  ?Truddie Hidden, MD ?02/26/22 [phone] ? ?

## 2022-02-28 MED FILL — Hydrocodone-Acetaminophen Tab 5-325 MG: ORAL | Qty: 6 | Status: AC

## 2022-04-23 ENCOUNTER — Other Ambulatory Visit: Payer: Self-pay | Admitting: Urology

## 2022-04-23 DIAGNOSIS — C61 Malignant neoplasm of prostate: Secondary | ICD-10-CM

## 2022-06-28 ENCOUNTER — Ambulatory Visit
Admission: RE | Admit: 2022-06-28 | Discharge: 2022-06-28 | Disposition: A | Discharge status: Home / Self Care | Payer: Managed Care, Other (non HMO) | Source: Ambulatory Visit | Attending: Urology | Admitting: Urology

## 2022-06-28 DIAGNOSIS — C61 Malignant neoplasm of prostate: Secondary | ICD-10-CM

## 2022-06-28 MED ORDER — GADOBENATE DIMEGLUMINE 529 MG/ML IV SOLN
15.0000 mL | Freq: Once | INTRAVENOUS | Status: AC | PRN
  Administered 2022-06-28: 15 mL via INTRAVENOUS

## 2022-07-26 ENCOUNTER — Other Ambulatory Visit: Payer: Self-pay | Admitting: Urology

## 2022-07-31 NOTE — Therapy (Signed)
OUTPATIENT PHYSICAL THERAPY MALE PELVIC EVALUATION   Patient Name: Jake Perkins MRN: [id number] DOB:1976-04-06, 46 y.o., male Today's Date: 08/02/2022   PT End of Session - 08/02/22 0838     Visit Number 1    Date for PT Re-Evaluation 10/25/22    Authorization Type [employer]    Authorization - Visit Number 1    Authorization - Number of Visits 60    PT Start Time 0800    PT Stop Time 0840    PT Time Calculation (min) 40 min    Activity Tolerance Patient tolerated treatment well    Behavior During Therapy [facility] for tasks assessed/performed             Past Medical History:  Diagnosis Date   Carpal tunnel syndrome, bilateral    Diabetes mellitus    Past Surgical History:  Procedure Laterality Date   CARPAL TUNNEL RELEASE Right 09/30/2019   Procedure: RIGHT CARPAL TUNNEL RELEASE;  Surgeon: Carole Civil, MD;  Location: [facility];  Service: Orthopedics;  Laterality: Right;   CARPAL TUNNEL RELEASE Left 09/09/2019   Procedure: LEFT CARPAL TUNNEL RELEASE;  Surgeon: Carole Civil, MD;  Location: [facility];  Service: Orthopedics;  Laterality: Left;   Patient Active Problem List   Diagnosis Date Noted   Carpal tunnel syndrome of right wrist    S/P carpal tunnel release left 09/09/2019 right 09/30/2019 09/22/2019   Carpal tunnel syndrome of left wrist    Diabetes mellitus ([place]) 07/09/2018    PCP: Ranae Palms, MD  REFERRING PROVIDER: Raynelle Bring, MD  REFERRING DIAG: [place] (ICD-10-CM) - Prostate cancer [facility])  THERAPY DIAG:  Muscle weakness (generalized)  Prostate cancer [facility])  Rationale for Evaluation and Treatment Rehabilitation  ONSET DATE: 12/2021  SUBJECTIVE:                                                                                                                                                                                           SUBJECTIVE STATEMENT: Patient will have robotic assisted laparoscopic radical prostactomy and lymphadenectomy bilateral on  10/10/2022. Gets frequent UTI's.  Fluid intake: water   PAIN:  Are you having pain? Yes NPRS scale: 2/10 Pain location:  lower abdominal  Pain type: aching Pain description: intermittent   Aggravating factors: randomly Relieving factors: not sure  PRECAUTIONS: Other: prostate cancer  WEIGHT BEARING RESTRICTIONS No  FALLS:  Has patient fallen in last 6 months? No  LIVING ENVIRONMENT: Lives with: lives with their family   OCCUPATION: lifting, walking, pushing stretchers, pulling stretcher, has a lifter to assist at work  PLOF: Independent  PATIENT GOALS strengthen the muscles  to prepare for surgery.   PERTINENT HISTORY:  Prostate cancer; diabetes  BOWEL MOVEMENT Pain with bowel movement: No  URINATION Pain with urination: No Fully empty bladder: Yes:   Stream: Strong Urgency: yes  Frequency: 2-3 hours Leakage: Urge to void and Walking to the bathroom Pads: No  INTERCOURSE Pain with intercourse: No and no trouble with erection      OBJECTIVE:   DIAGNOSTIC FINDINGS:  none  PATIENT SURVEYS:  PFIQ-7 none  COGNITION:  Overall cognitive status: Within functional limits for tasks assessed     SENSATION:  Light touch: Appears intact  Proprioception: Appears intact    POSTURE: No Significant postural limitations   PELVIC ALIGNMENT:  LUMBARAROM/PROM  A/PROM A/PROM  eval  Flexion Decreased by 25%  Extension Decreased by 25%  Right lateral flexion Decreased by 25%  Left lateral flexion Decreased by 25%  Right rotation Decreased by 25%  Left rotation Decreased by 25%   (Blank rows = not tested)  LOWER EXTREMITY AROM/PROM: Bilateral hip ROM is full  A/PROM Right eval Left eval  Hip flexion    Hip extension    Hip abduction    Hip adduction    Hip internal rotation    Hip external rotation    Knee flexion    Knee extension    Ankle dorsiflexion    Ankle plantarflexion    Ankle inversion    Ankle eversion     (Blank rows = not  tested)  LOWER EXTREMITY MMT:  MMT Right eval Left eval  Hip flexion 4/5 4/5  Hip extension 4/5 4/5  Hip abduction 4/5 4/5  Hip adduction 5/5 5/5  Hip internal rotation 4/5 4/5  Hip external rotation 4/5 4/5  Knee flexion    Knee extension    Ankle dorsiflexion    Ankle plantarflexion    Ankle inversion    Ankle eversion     PALPATION: GENERAL No abdominal tenderness              External Perineal Exam no tenderness located in the pelvic floor muscles at this time              Internal Pelvic Floor not assessed Patient confirms identification and approves PT to assess internal pelvic floor and treatment No  PELVIC MMT:   MMT eval  Internal Anal Sphincter   External Anal Sphincter   Puborectalis   Diastasis Recti   (Blank rows = not tested)   TONE: average  TODAY'S TREATMENT  EVAL Date: 08/02/2022 HEP established-see below    PATIENT EDUCATION:  Education details: Access Code: [email or web address] Person educated: Patient Education method: Explanation, Demonstration, Tactile cues, Verbal cues, and Handouts Education comprehension: verbalized understanding, returned demonstration, verbal cues required, tactile cues required, and needs further education   HOME EXERCISE PROGRAM: 08/02/2022 Access Code: [email or web address] URL: [email or web address] Date: 08/02/2022 Prepared by: Earlie Counts  Exercises - Prone Press Up  - 1 x daily - 7 x weekly - 3 sets - 10 reps - Seated Pelvic Floor Contraction  - 4 x daily - 7 x weekly - 1 sets - 10 reps - 10 sec hold - Seated Quick Flick Pelvic Floor Contractions  - 4 x daily - 7 x weekly - 1 sets - 5 reps - Seated Piriformis Stretch with Trunk Bend  - 1 x daily - 7 x weekly - 1 sets - 2 reps - 30 sec hold  ASSESSMENT:  CLINICAL IMPRESSION: Patient is a 46 y.o. male  who was seen  today for physical therapy evaluation and treatment for prostate cancer.Patient is schedule for  robotic assisted laparoscopic radical proctectomy and  lymphadenectomy bilateral on 10/10/2022. Patient has weakness in bilateral hips at 4/5. Patient reports urgency with some urinary leakage once he is at the commode. Patient reports lower abdominal pain at level 2/10 that comes on randomly. Patient is able to feel his pelvic floor contract with his hand and penis coming downward. Patient has had frequent UTI's. Patient will benefit from skilled therapy to be ready for proctectomy, understand how to strengthen the pelvic floor, and exercises to strengthen the hips and abdomen.    OBJECTIVE IMPAIRMENTS decreased activity tolerance, decreased coordination, decreased strength, and pain.   ACTIVITY LIMITATIONS continence  PARTICIPATION LIMITATIONS:  none at this time  PERSONAL FACTORS 1-2 comorbidities: diabetes, prostate cancer  are also affecting patient's functional outcome.   REHAB POTENTIAL: Excellent  CLINICAL DECISION MAKING: Stable/uncomplicated  EVALUATION COMPLEXITY: Low   GOALS: Goals reviewed with patient? Yes  SHORT TERM GOALS: Target date: 08/30/2022   Patient independent with pelvic floor exercises to reduce urinary leakage after surgery.  Baseline: Goal status: INITIAL  2.  Patient educated on correct toileting to reduce strain on the pelvic floor when having a bowel movement after surgery.  Baseline:  Goal status: INITIAL  3.  Patient educated on ways to reduce swelling after surgery.  Baseline:  Goal status: INITIAL  4.  Patient educated on ways to move in bed and transfers putting less strain on abdomen after surgery.  Baseline:  Goal status: INITIAL  5.  Patient educated on walking program for before and after surgery.  Baseline:  Goal status: INITIAL   LONG TERM GOALS: Target date: 10/25/2022   New goals made for after surgery and patient is reassessed.  Baseline:  Goal status: INITIAL    PLAN: PT FREQUENCY: 1x/week  PT DURATION: 12 weeks  PLANNED INTERVENTIONS: Therapeutic exercises,  Therapeutic activity, Neuromuscular re-education, Patient/Family education, Self Care, Joint mobilization, Dry Needling, Spinal mobilization, Biofeedback, and Manual therapy  PLAN FOR NEXT SESSION: education on abdominal contraction, gluteals, pelvic floor, hip stretches, squats, walking program   Earlie Counts, PT 08/02/22 12:02 PM

## 2022-08-02 ENCOUNTER — Other Ambulatory Visit: Payer: Self-pay

## 2022-08-02 ENCOUNTER — Ambulatory Visit: Payer: Managed Care, Other (non HMO) | Attending: Urology | Admitting: Physical Therapy

## 2022-08-02 ENCOUNTER — Encounter: Payer: Self-pay | Admitting: Physical Therapy

## 2022-08-02 DIAGNOSIS — M6281 Muscle weakness (generalized): Secondary | ICD-10-CM | POA: Diagnosis not present

## 2022-08-02 DIAGNOSIS — M545 Low back pain, unspecified: Secondary | ICD-10-CM | POA: Insufficient documentation

## 2022-08-02 DIAGNOSIS — C61 Malignant neoplasm of prostate: Secondary | ICD-10-CM | POA: Diagnosis present

## 2022-08-02 DIAGNOSIS — R1030 Lower abdominal pain, unspecified: Secondary | ICD-10-CM | POA: Insufficient documentation

## 2022-08-05 ENCOUNTER — Ambulatory Visit: Payer: Managed Care, Other (non HMO) | Admitting: Physical Therapy

## 2022-08-16 ENCOUNTER — Ambulatory Visit: Payer: Managed Care, Other (non HMO) | Admitting: Physical Therapy

## 2022-08-16 ENCOUNTER — Encounter: Payer: Self-pay | Admitting: Physical Therapy

## 2022-08-16 DIAGNOSIS — M6281 Muscle weakness (generalized): Secondary | ICD-10-CM

## 2022-08-16 DIAGNOSIS — C61 Malignant neoplasm of prostate: Secondary | ICD-10-CM

## 2022-08-16 NOTE — Patient Instructions (Addendum)

## 2022-08-16 NOTE — Therapy (Addendum)
OUTPATIENT PHYSICAL THERAPY TREATMENT NOTE   Patient Name: Jake Perkins MRN: [id number] DOB:[birth year], 46 y.o., male Today's Date: 08/16/2022  PCP: Ranae Palms, MD REFERRING PROVIDER: Raynelle Bring, MD  END OF SESSION:   PT End of Session - 08/16/22 1100     Visit Number 2    Date for PT Re-Evaluation 10/25/22    Authorization Type [employer]    Authorization - Visit Number 2    Authorization - Number of Visits 60    PT Start Time 1100    PT Stop Time 1140    PT Time Calculation (min) 40 min    Activity Tolerance Patient tolerated treatment well    Behavior During Therapy [facility] for tasks assessed/performed             Past Medical History:  Diagnosis Date   Carpal tunnel syndrome, bilateral    Diabetes mellitus    Past Surgical History:  Procedure Laterality Date   CARPAL TUNNEL RELEASE Right 09/30/2019   Procedure: RIGHT CARPAL TUNNEL RELEASE;  Surgeon: Carole Civil, MD;  Location: [facility];  Service: Orthopedics;  Laterality: Right;   CARPAL TUNNEL RELEASE Left 09/09/2019   Procedure: LEFT CARPAL TUNNEL RELEASE;  Surgeon: Carole Civil, MD;  Location: [facility];  Service: Orthopedics;  Laterality: Left;   Patient Active Problem List   Diagnosis Date Noted   Carpal tunnel syndrome of right wrist    S/P carpal tunnel release left 09/09/2019 right 09/30/2019 09/22/2019   Carpal tunnel syndrome of left wrist    Diabetes mellitus ([place]) 07/09/2018   REFERRING DIAG: C61 (ICD-10-CM) - Prostate cancer ([place])   THERAPY DIAG:  Muscle weakness (generalized)   Prostate cancer ([place])   Rationale for Evaluation and Treatment Rehabilitation   ONSET DATE: 12/2021   SUBJECTIVE:                                                                                                                                                                                            SUBJECTIVE STATEMENT: My back was a little sore.     PAIN:  Are you having pain? Yes NPRS scale:  2/10 Pain location:  lower abdominal   Pain type: aching Pain description: intermittent    Aggravating factors: randomly Relieving factors: not sure   PRECAUTIONS: Other: prostate cancer   WEIGHT BEARING RESTRICTIONS No   FALLS:  Has patient fallen in last 6 months? No   LIVING ENVIRONMENT: Lives with: lives with their family     OCCUPATION: lifting, walking, pushing stretchers, pulling stretcher, has a lifter to assist at work   PLOF: Independent  PATIENT GOALS strengthen the muscles to prepare for surgery.    PERTINENT HISTORY:  Prostate cancer; diabetes   BOWEL MOVEMENT Pain with bowel movement: No   URINATION Pain with urination: No Fully empty bladder: Yes:   Stream: Strong Urgency: yes  Frequency: 2-3 hours Leakage: Urge to void and Walking to the bathroom Pads: No   INTERCOURSE Pain with intercourse: No and no trouble with erection           OBJECTIVE:    DIAGNOSTIC FINDINGS:  none   PATIENT SURVEYS:  PFIQ-7 none   COGNITION:            Overall cognitive status: Within functional limits for tasks assessed                          SENSATION:            Light touch: Appears intact            Proprioception: Appears intact                 POSTURE: No Significant postural limitations               PELVIC ALIGNMENT:   LUMBARAROM/PROM   A/PROM A/PROM  eval  Flexion Decreased by 25%  Extension Decreased by 25%  Right lateral flexion Decreased by 25%  Left lateral flexion Decreased by 25%  Right rotation Decreased by 25%  Left rotation Decreased by 25%   (Blank rows = not tested)   LOWER EXTREMITY AROM/PROM: Bilateral hip ROM is full   A/PROM Right eval Left eval  Hip flexion      Hip extension      Hip abduction      Hip adduction      Hip internal rotation      Hip external rotation      Knee flexion      Knee extension      Ankle dorsiflexion      Ankle plantarflexion      Ankle inversion      Ankle eversion        (Blank rows = not tested)   LOWER EXTREMITY MMT:   MMT Right eval Left eval  Hip flexion 4/5 4/5  Hip extension 4/5 4/5  Hip abduction 4/5 4/5  Hip adduction 5/5 5/5  Hip internal rotation 4/5 4/5  Hip external rotation 4/5 4/5  Knee flexion      Knee extension      Ankle dorsiflexion      Ankle plantarflexion      Ankle inversion      Ankle eversion        PALPATION: GENERAL No abdominal tenderness               External Perineal Exam no tenderness located in the pelvic floor muscles at this time               Internal Pelvic Floor not assessed Patient confirms identification and approves PT to assess internal pelvic floor and treatment No   PELVIC MMT:   MMT eval  Internal Anal Sphincter    External Anal Sphincter    Puborectalis    Diastasis Recti    (Blank rows = not tested)     TONE: average   TODAY'S TREATMENT  08/16/2022 Exercises: Stretches/mobility:supine hamstring stretch holding 30 sec bil.  Supine trunk stretch 30 sec bil.  Quadruped cat cow 15 times with tactile cues  Ardine Eng pose hold 30 sec Strengthening: transverse abdominus with ball squeeze Bridge with ball squeeze Therapeutic activities: Functional strengthening activities:standing with increased distance between rib cage and pubic bone to reduce pressure on the bladder.  Self-care: Educated patient with walking program and to start 30 minutes per day  Educated patient on aftercare with massaging the perineal area, ice and elevation of the perineal area, not to do pelvic floor exercises with catheter in.  Educated patient about walking program and importance of doing it prior to surgery and after    EVAL Date: 08/02/2022 HEP established-see below      PATIENT EDUCATION:  Education details: Access Code: [email or web address]; after care after surgery Person educated: Patient Education method: Explanation, Demonstration, Tactile cues, Verbal cues, and Handouts Education comprehension: verbalized  understanding, returned demonstration, verbal cues required, tactile cues required, and needs further education     HOME EXERCISE PROGRAM: 08/02/2022 Access Code: [email or web address] URL: [email or web address] Date: 08/16/2022 Prepared by: Earlie Counts  Program Notes walking 30 minutes per day 3-4 times per [facility] the perineal area for 5 minutes after catheter removedsoft ice pack on the perineal area for 10 minutes 3 times per day right after surgerylay down with legs elevated 3 times per day for 10 minutes to reduce swelling  Exercises - Prone Press Up  - 1 x daily - 7 x weekly - 3 sets - 10 reps - Seated Pelvic Floor Contraction  - 4 x daily - 7 x weekly - 1 sets - 10 reps - 10 sec hold - Seated Quick Flick Pelvic Floor Contractions  - 4 x daily - 7 x weekly - 1 sets - 5 reps - Seated Piriformis Stretch with Trunk Bend  - 1 x daily - 7 x weekly - 1 sets - 2 reps - 30 sec hold - Supine Hamstring Stretch  - 1 x daily - 3 x weekly - 1 sets - 2 reps - 30 sec hold - Supine Piriformis Stretch with Leg Straight  - 1 x daily - 3 x weekly - 1 sets - 2 reps - 30 sec hold - Supine Bridge with Mini Swiss Ball Between Knees  - 1 x daily - 3 x weekly - 2 sets - 10 reps - Cat Cow  - 1 x daily - 3 x weekly - 1 sets - 10 reps - Child's Pose Stretch  - 1 x daily - 3 x weekly - 1 sets - 1 reps - 30 sec hold - Supine Transversus Abdominis Bracing with Pelvic Floor Contraction  - 1 x daily - 3 x weekly - 1 sets - 10 reps - 5 sec hold   ASSESSMENT:   CLINICAL IMPRESSION: Patient is a 46 y.o. male  who was seen today for physical therapy  treatment for prostate cancer. Patient has learned after care with his surgery to reduce strain on the pelvic floor and help with the swelling. Patient is learning ways to strengthen the core and pelvic floor. Patient will benefit from skilled therapy to be ready for proctectomy, understand how to strengthen the pelvic floor, and exercises to strengthen the hips and  abdomen.      OBJECTIVE IMPAIRMENTS decreased activity tolerance, decreased coordination, decreased strength, and pain.    ACTIVITY LIMITATIONS continence   PARTICIPATION LIMITATIONS:  none at this time   PERSONAL FACTORS 1-2 comorbidities: diabetes, prostate cancer  are also affecting patient's functional outcome.    REHAB POTENTIAL: Excellent   CLINICAL DECISION MAKING: Stable/uncomplicated   EVALUATION COMPLEXITY: Low  GOALS: Goals reviewed with patient? Yes   SHORT TERM GOALS: Target date: 08/30/2022     Patient independent with pelvic floor exercises to reduce urinary leakage after surgery.  Baseline: Goal status: Met 08/16/2022   2.  Patient educated on correct toileting to reduce strain on the pelvic floor when having a bowel movement after surgery.  Baseline:  Goal status: Met 08/16/2022   3.  Patient educated on ways to reduce swelling after surgery.  Baseline:  Goal status: Met 08/16/2022   4.  Patient educated on ways to move in bed and transfers putting less strain on abdomen after surgery.  Baseline:  Goal status: INITIAL   5.  Patient educated on walking program for before and after surgery.  Baseline:  Goal status: Met 08/16/2022     LONG TERM GOALS: Target date: 10/25/2022    New goals made for after surgery and patient is reassessed.  Baseline:  Goal status: INITIAL       PLAN: PT FREQUENCY: 1x/week   PT DURATION: 12 weeks   PLANNED INTERVENTIONS: Therapeutic exercises, Therapeutic activity, Neuromuscular re-education, Patient/Family education, Self Care, Joint mobilization, Dry Needling, Spinal mobilization, Biofeedback, and Manual therapy   PLAN FOR NEXT SESSION: education on abdominal contraction, gluteals, pelvic floor, squats , advance core exercises; moving in bed after surgery  Earlie Counts, PT 08/16/22 11:54 AM   PHYSICAL THERAPY DISCHARGE SUMMARY  Visits from Start of Care: 2  Current functional level related to goals /  functional outcomes: Patient called to cancel the remainder of his appointments and wanted to be discharged.    Remaining deficits: See above.    Education / Equipment: HEP   Patient agrees to discharge. Patient goals were not met. Patient is being discharged due to the patient's request. Thank you for the referral. Earlie Counts, PT 09/12/22 12:02 PM

## 2022-09-06 ENCOUNTER — Ambulatory Visit: Payer: Managed Care, Other (non HMO) | Admitting: Physical Therapy

## 2022-09-13 ENCOUNTER — Ambulatory Visit: Payer: Managed Care, Other (non HMO) | Admitting: Physical Therapy

## 2022-09-27 ENCOUNTER — Other Ambulatory Visit (HOSPITAL_COMMUNITY): Payer: Managed Care, Other (non HMO)

## 2022-10-04 ENCOUNTER — Encounter: Payer: Managed Care, Other (non HMO) | Admitting: Physical Therapy

## 2022-10-10 ENCOUNTER — Ambulatory Visit (HOSPITAL_COMMUNITY): Admit: 2022-10-10 | Payer: Managed Care, Other (non HMO) | Admitting: Urology

## 2022-10-10 SURGERY — XI ROBOTIC ASSISTED LAPAROSCOPIC RADICAL PROSTATECTOMY LEVEL 1
Anesthesia: General

## 2022-11-07 ENCOUNTER — Other Ambulatory Visit: Payer: Self-pay | Admitting: Urology

## 2022-11-07 DIAGNOSIS — C61 Malignant neoplasm of prostate: Secondary | ICD-10-CM

## 2022-12-07 ENCOUNTER — Ambulatory Visit
Admission: RE | Admit: 2022-12-07 | Discharge: 2022-12-07 | Disposition: A | Discharge status: Home / Self Care | Payer: Managed Care, Other (non HMO) | Source: Ambulatory Visit | Attending: Urology | Admitting: Urology

## 2022-12-07 DIAGNOSIS — C61 Malignant neoplasm of prostate: Secondary | ICD-10-CM

## 2022-12-07 MED ORDER — GADOPICLENOL 0.5 MMOL/ML IV SOLN
7.5000 mL | Freq: Once | INTRAVENOUS | Status: AC | PRN
  Administered 2022-12-07: 7.5 mL via INTRAVENOUS
